# Patient Record
Sex: Female | Born: 1960 | Race: Black or African American | Hispanic: No | Smoking: Never smoker
Health system: Southern US, Community
[De-identification: ages and names within clinical notes are randomized; demographics above are authoritative.]

## PROBLEM LIST (undated history)

## (undated) DIAGNOSIS — M549 Dorsalgia, unspecified: Secondary | ICD-10-CM

## (undated) DIAGNOSIS — N189 Chronic kidney disease, unspecified: Secondary | ICD-10-CM

## (undated) DIAGNOSIS — E669 Obesity, unspecified: Secondary | ICD-10-CM

## (undated) DIAGNOSIS — K219 Gastro-esophageal reflux disease without esophagitis: Secondary | ICD-10-CM

## (undated) DIAGNOSIS — G4733 Obstructive sleep apnea (adult) (pediatric): Secondary | ICD-10-CM

## (undated) DIAGNOSIS — J45909 Unspecified asthma, uncomplicated: Secondary | ICD-10-CM

## (undated) DIAGNOSIS — T7840XA Allergy, unspecified, initial encounter: Secondary | ICD-10-CM

## (undated) DIAGNOSIS — G8929 Other chronic pain: Secondary | ICD-10-CM

## (undated) DIAGNOSIS — M1990 Unspecified osteoarthritis, unspecified site: Secondary | ICD-10-CM

## (undated) DIAGNOSIS — I10 Essential (primary) hypertension: Secondary | ICD-10-CM

## (undated) HISTORY — PX: ABDOMINAL HYSTERECTOMY: SHX81

## (undated) HISTORY — DX: Allergy, unspecified, initial encounter: T78.40XA

## (undated) HISTORY — DX: Unspecified osteoarthritis, unspecified site: M19.90

## (undated) HISTORY — PX: KNEE SURGERY: SHX244

## (undated) HISTORY — DX: Gastro-esophageal reflux disease without esophagitis: K21.9

## (undated) HISTORY — DX: Chronic kidney disease, unspecified: N18.9

---

## 2013-10-23 ENCOUNTER — Ambulatory Visit: Payer: Self-pay | Admitting: Internal Medicine

## 2013-11-06 ENCOUNTER — Ambulatory Visit: Payer: Self-pay | Admitting: Internal Medicine

## 2014-08-01 ENCOUNTER — Encounter (HOSPITAL_COMMUNITY): Payer: Self-pay | Admitting: Emergency Medicine

## 2014-08-01 ENCOUNTER — Emergency Department (HOSPITAL_COMMUNITY)
Admission: EM | Admit: 2014-08-01 | Discharge: 2014-08-01 | Disposition: A | Discharge status: Home / Self Care | Payer: BC Managed Care – PPO | Attending: Emergency Medicine | Admitting: Emergency Medicine

## 2014-08-01 DIAGNOSIS — S199XXA Unspecified injury of neck, initial encounter: Secondary | ICD-10-CM | POA: Diagnosis not present

## 2014-08-01 DIAGNOSIS — Z8709 Personal history of other diseases of the respiratory system: Secondary | ICD-10-CM | POA: Insufficient documentation

## 2014-08-01 DIAGNOSIS — M549 Dorsalgia, unspecified: Secondary | ICD-10-CM

## 2014-08-01 DIAGNOSIS — Y92410 Unspecified street and highway as the place of occurrence of the external cause: Secondary | ICD-10-CM | POA: Diagnosis not present

## 2014-08-01 DIAGNOSIS — Y9389 Activity, other specified: Secondary | ICD-10-CM | POA: Diagnosis not present

## 2014-08-01 DIAGNOSIS — E669 Obesity, unspecified: Secondary | ICD-10-CM | POA: Diagnosis not present

## 2014-08-01 DIAGNOSIS — G8929 Other chronic pain: Secondary | ICD-10-CM | POA: Diagnosis not present

## 2014-08-01 DIAGNOSIS — J45909 Unspecified asthma, uncomplicated: Secondary | ICD-10-CM | POA: Diagnosis not present

## 2014-08-01 DIAGNOSIS — I10 Essential (primary) hypertension: Secondary | ICD-10-CM | POA: Diagnosis not present

## 2014-08-01 DIAGNOSIS — S3992XA Unspecified injury of lower back, initial encounter: Secondary | ICD-10-CM | POA: Diagnosis not present

## 2014-08-01 HISTORY — DX: Obstructive sleep apnea (adult) (pediatric): G47.33

## 2014-08-01 HISTORY — DX: Unspecified asthma, uncomplicated: J45.909

## 2014-08-01 HISTORY — DX: Essential (primary) hypertension: I10

## 2014-08-01 HISTORY — DX: Other chronic pain: G89.29

## 2014-08-01 HISTORY — DX: Dorsalgia, unspecified: M54.9

## 2014-08-01 HISTORY — DX: Obesity, unspecified: E66.9

## 2014-08-01 MED ORDER — IBUPROFEN 600 MG PO TABS: 600.0000 mg | ORAL_TABLET | Freq: Three times a day (TID) | ORAL | Status: DC

## 2014-08-01 MED ORDER — METHOCARBAMOL 500 MG PO TABS: 500.0000 mg | ORAL_TABLET | Freq: Two times a day (BID) | ORAL | Status: DC
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
## 2014-08-01 NOTE — Discharge Instructions (Signed)

## 2014-08-02 NOTE — ED Provider Notes (Signed)
Medical screening examination/treatment/procedure(s) were performed by non-physician practitioner and as supervising physician I was immediately available for consultation/collaboration.   EKG Interpretation None        Elwin MochaBlair Ellana Kawa, MD 08/02/14 [phone]

## 2014-08-04 ENCOUNTER — Encounter: Payer: Self-pay | Admitting: Internal Medicine

## 2016-07-26 ENCOUNTER — Emergency Department
Admission: EM | Admit: 2016-07-26 | Discharge: 2016-07-26 | Disposition: A | Discharge status: Home / Self Care | Payer: BLUE CROSS/BLUE SHIELD | Attending: Emergency Medicine | Admitting: Emergency Medicine

## 2016-07-26 ENCOUNTER — Emergency Department: Payer: BLUE CROSS/BLUE SHIELD

## 2016-07-26 ENCOUNTER — Encounter: Payer: Self-pay | Admitting: Emergency Medicine

## 2016-07-26 DIAGNOSIS — Z79899 Other long term (current) drug therapy: Secondary | ICD-10-CM | POA: Insufficient documentation

## 2016-07-26 DIAGNOSIS — E876 Hypokalemia: Secondary | ICD-10-CM | POA: Diagnosis not present

## 2016-07-26 DIAGNOSIS — I10 Essential (primary) hypertension: Secondary | ICD-10-CM | POA: Diagnosis not present

## 2016-07-26 DIAGNOSIS — J45909 Unspecified asthma, uncomplicated: Secondary | ICD-10-CM | POA: Diagnosis not present

## 2016-07-26 DIAGNOSIS — R002 Palpitations: Secondary | ICD-10-CM

## 2016-07-26 DIAGNOSIS — Z7982 Long term (current) use of aspirin: Secondary | ICD-10-CM | POA: Diagnosis not present

## 2016-07-26 DIAGNOSIS — R42 Dizziness and giddiness: Secondary | ICD-10-CM | POA: Diagnosis present

## 2016-07-26 LAB — CBC
HCT: 42.6 % (ref 35.0–47.0)
Hemoglobin: 14.4 g/dL (ref 12.0–16.0)
MCH: 26.6 pg (ref 26.0–34.0)
MCHC: 33.8 g/dL (ref 32.0–36.0)
MCV: 78.9 fL — ABNORMAL LOW (ref 80.0–100.0)
PLATELETS: 223 10*3/uL (ref 150–440)
RBC: 5.4 MIL/uL — ABNORMAL HIGH (ref 3.80–5.20)
RDW: 14.4 % (ref 11.5–14.5)
WBC: 5.5 10*3/uL (ref 3.6–11.0)

## 2016-07-26 LAB — URINALYSIS COMPLETE WITH MICROSCOPIC (ARMC ONLY)
BILIRUBIN URINE: NEGATIVE
Bacteria, UA: NONE SEEN
GLUCOSE, UA: NEGATIVE mg/dL
Hgb urine dipstick: NEGATIVE
KETONES UR: NEGATIVE mg/dL
LEUKOCYTES UA: NEGATIVE
NITRITE: NEGATIVE
PH: 8 (ref 5.0–8.0)
Protein, ur: NEGATIVE mg/dL
SPECIFIC GRAVITY, URINE: 1.011 (ref 1.005–1.030)

## 2016-07-26 LAB — BASIC METABOLIC PANEL
Anion gap: 8 (ref 5–15)
BUN: 15 mg/dL (ref 6–20)
CO2: 30 mmol/L (ref 22–32)
CREATININE: 0.73 mg/dL (ref 0.44–1.00)
Calcium: 9.5 mg/dL (ref 8.9–10.3)
Chloride: 102 mmol/L (ref 101–111)
GFR calc Af Amer: >60 mL/min (ref >60)
Glucose, Bld: 106 mg/dL — ABNORMAL HIGH (ref 65–99)
POTASSIUM: 3.3 mmol/L — AB (ref 3.5–5.1)
SODIUM: 140 mmol/L (ref 135–145)

## 2016-07-26 LAB — TROPONIN I: Troponin I: <0.03 ng/mL (ref <0.03)

## 2016-07-26 MED ORDER — POTASSIUM CHLORIDE CRYS ER 20 MEQ PO TBCR
20.0000 meq | EXTENDED_RELEASE_TABLET | Freq: Once | ORAL | Status: AC
  Administered 2016-07-26: 20 meq via ORAL
  Filled 2016-07-26: qty 1
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
## 2016-07-26 NOTE — ED Provider Notes (Signed)
[facility] Emergency Department Provider Note   ____________________________________________   First MD Initiated Contact with Patient 07/26/16 [phone]     (approximate)  I have reviewed the triage vital signs and the nursing notes.   HISTORY  Chief Complaint Dizziness   HPI Kamree Anglada is a 55 y.o. female with a history of hypertension as well as palpitations who is presenting to the emergency department with palpitations. She says that about 7:30 this morning she began to feel a "fluttering" in her chest. She says that she has had palpitations in the past, about 15 years ago. She said that during this episode she also felt lightheaded. She denies losing consciousness. Denies any chest pain. Denies any excessive caffeine intake. Denies any anxiety or stress. Denies any drug use. She said that the episode resolved spontaneously and that she now feels back to her baseline.   Past Medical History:  Diagnosis Date  . Asthma   . Chronic back pain   . Hypertension   . Obesity   . OSA (obstructive sleep apnea)     There are no active problems to display for this patient.   Past Surgical History:  Procedure Laterality Date  . ABDOMINAL HYSTERECTOMY    . KNEE SURGERY      Prior to Admission medications   Medication Sig Start Date End Date Taking? Authorizing Provider  amLODipine (NORVASC) 10 MG tablet Take 10 mg by mouth daily.   Yes Historical Provider, MD  aspirin 81 MG chewable tablet Chew by mouth daily.   Yes Historical Provider, MD  Calcium Carb-Cholecalciferol (CALCIUM-VITAMIN D) 600-400 MG-UNIT TABS Take 1 tablet by mouth 2 (two) times daily.   Yes Historical Provider, MD  cetirizine (ZYRTEC) 10 MG tablet Take 10 mg by mouth daily.   Yes Historical Provider, MD  Cholecalciferol 1000 units tablet Take 1,000 Units by mouth daily.   Yes Historical Provider, MD  cyanocobalamin 1000 MCG tablet Take 1,000 mcg by mouth daily.   Yes Historical  Provider, MD  fluticasone (FLONASE) 50 MCG/ACT nasal spray Place 2 sprays into both nostrils daily.   Yes Historical Provider, MD  hydrochlorothiazide (HYDRODIURIL) 25 MG tablet Take 25 mg by mouth daily.  07/14/14  Yes Historical Provider, MD  losartan (COZAAR) 100 MG tablet Take 100 mg by mouth daily.   Yes Historical Provider, MD  montelukast (SINGULAIR) 10 MG tablet Take 10 mg by mouth at bedtime.   Yes Historical Provider, MD  naproxen sodium (ANAPROX) 220 MG tablet Take 220 mg by mouth 2 (two) times daily with a meal.   Yes Historical Provider, MD  Omega-3 Fatty Acids (FISH OIL) 1000 MG CAPS Take 1 capsule by mouth 2 (two) times daily.   Yes Historical Provider, MD  PROVENTIL HFA 108 (90 BASE) MCG/ACT inhaler Inhale 1 puff into the lungs every 6 (six) hours as needed for wheezing or shortness of breath.  06/11/14  Yes Historical Provider, MD  SYMBICORT 160-4.5 MCG/ACT inhaler Inhale 2 puffs into the lungs daily.  07/14/14  Yes Historical Provider, MD  vitamin E 400 UNIT capsule Take 400 Units by mouth daily.   Yes Historical Provider, MD    Allergies Aspartame and phenylalanine; Lisinopril; and Sulfa antibiotics  History reviewed. No pertinent family history.  Social History Social History  Substance Use Topics  . Smoking status: Never Smoker  . Smokeless tobacco: Never Used  . Alcohol use No    Review of Systems Constitutional: No fever/chills Eyes: No visual changes. ENT:  No sore throat. Cardiovascular: Denies chest pain. Respiratory: Denies shortness of breath. Gastrointestinal: No abdominal pain.  No nausea, no vomiting.  No diarrhea.  No constipation. Genitourinary: Negative for dysuria. Musculoskeletal: Negative for back pain. Skin: Negative for rash. Neurological: Negative for headaches, focal weakness or numbness.  10-point ROS otherwise negative.  ____________________________________________   PHYSICAL EXAM:  VITAL SIGNS: ED Triage Vitals [07/26/16 0909]  Enc  Vitals Group     BP (!) 152/86     Pulse Rate 70     Resp 18     Temp 98 F (36.7 C)     Temp Source Oral     SpO2 97 %     Weight 257 lb (116.6 kg)     Height 5\' 4"  (1.626 m)     Head Circumference      Peak Flow      Pain Score 0     Pain Loc      Pain Edu?      Excl. in GC?     Constitutional: Alert and oriented. Well appearing and in no acute distress. Eyes: Conjunctivae are normal. PERRL. EOMI. Head: Atraumatic. Nose: No congestion/rhinnorhea. Mouth/Throat: Mucous membranes are moist.   Neck: No stridor.   Cardiovascular: Normal rate, regular rhythm. Grossly normal heart sounds.  Good peripheral circulation. Respiratory: Normal respiratory effort.  No retractions. Lungs CTAB. Gastrointestinal: Soft and nontender. No distention. No CVA tenderness. Musculoskeletal: No lower extremity tenderness nor edema.  No joint effusions. Neurologic:  Normal speech and language. No gross focal neurologic deficits are appreciated. No gait instability. Skin:  Skin is warm, dry and intact. No rash noted. Psychiatric: Mood and affect are normal. Speech and behavior are normal.  ____________________________________________   LABS (all labs ordered are listed, but only abnormal results are displayed)  Labs Reviewed  BASIC METABOLIC PANEL - Abnormal; Notable for the following:       Result Value   Potassium 3.3 (*)    Glucose, Bld 106 (*)    All other components within normal limits  CBC - Abnormal; Notable for the following:    RBC 5.40 (*)    MCV 78.9 (*)    All other components within normal limits  URINALYSIS COMPLETEWITH MICROSCOPIC ([facility] ONLY) - Abnormal; Notable for the following:    Color, Urine YELLOW (*)    APPearance HAZY (*)    Squamous Epithelial / LPF 0-5 (*)    All other components within normal limits  TROPONIN I  CBG MONITORING, ED   ____________________________________________  EKG  ED ECG REPORT I, Arelia Longest, the attending physician, personally  viewed and interpreted this ECG.   Date: 07/26/2016  EKG Time: 0916   Rate: 69  Rhythm: normal sinus rhythm  Axis: normal  Intervals:none  ST&T Change: No ST segment elevation or depression. No abnormal T-wave inversion.  ____________________________________________  RADIOLOGY    DG Chest 2 View (Final result)  Result time 07/26/16 11:01:43  Final result by Oley Balm, MD (07/26/16 11:01:43)           Narrative:   CLINICAL DATA: Pt to ed with c/o "heart fluttering" And weakness and dizziness. Pt states it started this morning. Pt states she has no dizziness or SOB.  EXAM: CHEST - 2 VIEW  COMPARISON: none  FINDINGS: Lungs are clear.  Heart size and mediastinal contours are within normal limits.  No effusion. No pneumothorax.  Visualized bones unremarkable.  IMPRESSION: No acute cardiopulmonary disease.   Electronically Signed By:  Corlis Leak Hassell M.D. On: 07/26/2016 11:01          ____________________________________________   PROCEDURES  Procedure(s) performed:   Procedures  Critical Care performed:   ____________________________________________   INITIAL IMPRESSION / ASSESSMENT AND PLAN / ED COURSE  Pertinent labs & imaging results that were available during my care of the patient were reviewed by me and considered in my medical decision making (see chart for details).  ----------------------------------------- 1:11 PM on 07/26/2016 -----------------------------------------  Patient resting of the lid this time. Very reassuring workup and no further episodes of palpitations or arrhythmia throughout her stay here in the emergency department. She says that she is seen [facility] and has a follow-up appointment with her primary care doctor this Friday. We discussed referral through her primary care doctor to cardiology and she is understanding of this plan and willing to comply. Mild hypokalemia with repletion here in the  emergency department.   Clinical Course     ____________________________________________   FINAL CLINICAL IMPRESSION(S) / ED DIAGNOSES  Palpitations. Hypokalemia.    NEW MEDICATIONS STARTED DURING THIS VISIT:  New Prescriptions   No medications on file     Note:  This document was prepared using Dragon voice recognition software and may include unintentional dictation errors.    Myrna Blazeravid Matthew Soul Hackman, MD 07/26/16 [phone]

## 2016-07-26 NOTE — ED Notes (Signed)
Pt returned from radiology. Pt in NAD.

## 2017-07-11 ENCOUNTER — Ambulatory Visit (INDEPENDENT_AMBULATORY_CARE_PROVIDER_SITE_OTHER): Payer: BLUE CROSS/BLUE SHIELD | Admitting: Physician Assistant

## 2017-07-12 ENCOUNTER — Ambulatory Visit (INDEPENDENT_AMBULATORY_CARE_PROVIDER_SITE_OTHER): Payer: BLUE CROSS/BLUE SHIELD | Admitting: Physician Assistant

## 2021-02-16 ENCOUNTER — Other Ambulatory Visit: Payer: Self-pay

## 2021-02-16 DIAGNOSIS — I872 Venous insufficiency (chronic) (peripheral): Secondary | ICD-10-CM

## 2021-03-16 ENCOUNTER — Encounter: Payer: Self-pay | Admitting: Vascular Surgery

## 2021-03-16 ENCOUNTER — Encounter: Payer: Self-pay | Admitting: Physician Assistant

## 2021-03-16 ENCOUNTER — Other Ambulatory Visit: Payer: Self-pay

## 2021-03-16 ENCOUNTER — Ambulatory Visit (INDEPENDENT_AMBULATORY_CARE_PROVIDER_SITE_OTHER): Payer: No Typology Code available for payment source | Admitting: Physician Assistant

## 2021-03-16 ENCOUNTER — Ambulatory Visit (HOSPITAL_COMMUNITY)
Admission: RE | Admit: 2021-03-16 | Discharge: 2021-03-16 | Disposition: A | Discharge status: Home / Self Care | Payer: No Typology Code available for payment source | Source: Ambulatory Visit | Attending: Vascular Surgery | Admitting: Vascular Surgery

## 2021-03-16 VITALS — BP 119/73 | HR 73 | Temp 98.3°F | Resp 20 | Ht 64.0 in | Wt 245.3 lb

## 2021-03-16 DIAGNOSIS — M7989 Other specified soft tissue disorders: Secondary | ICD-10-CM | POA: Diagnosis not present

## 2021-03-16 DIAGNOSIS — I872 Venous insufficiency (chronic) (peripheral): Secondary | ICD-10-CM | POA: Insufficient documentation

## 2021-03-16 NOTE — Progress Notes (Signed)
Requested by:  [place] [id number] [address] [place],  [place] [address]  Reason for consultation: venous insufficiency of BLE   History of Present Illness   Joy Chang is a 60 y.o. (1960/11/04) female who presents for evaluation of bilateral lower extremity swelling. Initially she would have more edema in her right leg but It is the same in both legs now.  She says she has had swelling for over 20 years but more recently it has increased. She does elevate in a recliner with some improvement of swelling and also swelling is better upon first waking in the morning but then as the day goes on it increases. She cannot tolerate elevating for very long because she gets severe cramping and tingling in her legs, which is then painful. She wears knee high compression stockings from the [place]. She does have some improvement with them. She says she has a "light" and also "moderate" compression pair and she cannot tolerate the moderate for very long because they make her legs hurt. She explains that she does a lot of sitting during the day at work. Her ambulation is limited because of bilateral knee pain. She uses a cane to walk and wears bilateral knee braces.  She has no history of DVT. No family history of DVT or venous disease.   She has had some issues with hypertension and renal insufficiency in the past. She explains that now that her blood pressure is better managed that at her last follow up with her Nephrologist in February that everything was normal. She has no cardiac issues that she knows of  Venous symptoms include: swelling/ edema Onset/duration: > 20 years Occupation: Respiratory Therapist Aggravating factors: sitting, standing Alleviating factors: elevation, comrpression Compression:  Knee high Helps:  yes Pain medications: none Previous vein procedures: none History of DVT:  none  Past Medical History:  Diagnosis Date  . Asthma   . Chronic back pain   . Hypertension   .  Obesity   . OSA (obstructive sleep apnea)     Past Surgical History:  Procedure Laterality Date  . ABDOMINAL HYSTERECTOMY    . KNEE SURGERY      Social History   Socioeconomic History  . Marital status: Legally Separated    Spouse name: Not on file  . Number of children: 3  . Years of education: Not on file  . Highest education level: Not on file  Occupational History  . Not on file  Tobacco Use  . Smoking status: Never Smoker  . Smokeless tobacco: Never Used  Substance and Sexual Activity  . Alcohol use: No  . Drug use: No  . Sexual activity: Not on file  Other Topics Concern  . Not on file  Social History Narrative  . Not on file   Social Determinants of Health   Financial Resource Strain: Not on file  Food Insecurity: Not on file  Transportation Needs: Not on file  Physical Activity: Not on file  Stress: Not on file  Social Connections: Not on file  Intimate Partner Violence: Not on file   History reviewed. No pertinent family history.  Current Outpatient Medications  Medication Sig Dispense Refill  . amLODipine (NORVASC) 10 MG tablet Take 10 mg by mouth daily.    Marland Kitchen aspirin 81 MG chewable tablet Chew by mouth daily.    . Calcium Carb-Cholecalciferol (CALCIUM-VITAMIN D) 600-400 MG-UNIT TABS Take 1 tablet by mouth 2 (two) times daily.    . Cholecalciferol 1000  units tablet Take 1,000 Units by mouth daily.    . diclofenac Sodium (VOLTAREN) 1 % GEL Apply topically.    . ferrous sulfate 325 (65 FE) MG tablet Take by mouth.    . fluticasone (FLONASE) 50 MCG/ACT nasal spray Place 2 sprays into both nostrils daily.    . fluticasone-salmeterol (ADVAIR) 250-50 MCG/ACT AEPB Inhale into the lungs.    . hydrochlorothiazide (HYDRODIURIL) 25 MG tablet Take 25 mg by mouth daily.     . potassium chloride SA (KLOR-CON) 20 MEQ tablet Take by mouth.    . predniSONE (DELTASONE) 10 MG tablet 3 pills daily for 3 days then 2 pills daily for 3 days then 1 pill daily for 3 days    .  spironolactone (ALDACTONE) 25 MG tablet Take 1 tablet by mouth 2 (two) times daily.    . cetirizine (ZYRTEC) 10 MG tablet Take 10 mg by mouth daily. (Patient not taking: Reported on 03/16/2021)    . cyanocobalamin 1000 MCG tablet Take 1,000 mcg by mouth daily. (Patient not taking: Reported on 03/16/2021)    . fexofenadine (ALLEGRA) 60 MG tablet Take by mouth.    . montelukast (SINGULAIR) 10 MG tablet Take 10 mg by mouth at bedtime.    . naproxen sodium (ANAPROX) 220 MG tablet Take 220 mg by mouth 2 (two) times daily with a meal.    . Omega-3 Fatty Acids (FISH OIL) 1000 MG CAPS Take 1 capsule by mouth 2 (two) times daily.    Marland Kitchen PROVENTIL HFA 108 (90 BASE) MCG/ACT inhaler Inhale 1 puff into the lungs every 6 (six) hours as needed for wheezing or shortness of breath.     . vitamin E 400 UNIT capsule Take 400 Units by mouth daily.     No current facility-administered medications for this visit.    Allergies  Allergen Reactions  . Aspartame And Phenylalanine   . Lisinopril Cough  . Sulfa Antibiotics Hives    REVIEW OF SYSTEMS (negative unless checked):   Cardiac:  []  Chest pain or chest pressure? []  Shortness of breath upon activity? []  Shortness of breath when lying flat? []  Irregular heart rhythm?  Vascular:  []  Pain in calf, thigh, or hip brought on by walking? []  Pain in feet at night that wakes you up from your sleep? []  Blood clot in your veins? [x]  Leg swelling?  Pulmonary:  []  Oxygen at home? []  Productive cough? []  Wheezing?  Neurologic:  []  Sudden weakness in arms or legs? []  Sudden numbness in arms or legs? []  Sudden onset of difficult speaking or slurred speech? []  Temporary loss of vision in one eye? []  Problems with dizziness?  Gastrointestinal:  []  Blood in stool? []  Vomited blood?  Genitourinary:  []  Burning when urinating? []  Blood in urine?  Psychiatric:  []  Major depression  Hematologic:  []  Bleeding problems? []  Problems with blood  clotting?  Dermatologic:  []  Rashes or ulcers?  Constitutional:  []  Fever or chills?  Ear/Nose/Throat:  []  Change in hearing? []  Nose bleeds? []  Sore throat?  Musculoskeletal:  []  Back pain? []  Joint pain? []  Muscle pain?   Physical Examination     Vitals:   03/16/21 1059  BP: 119/73  Pulse: 73  Resp: 20  Temp: 98.3 F (36.8 C)  TempSrc: Temporal  SpO2: 98%  Weight: 245 lb 4.8 oz (111.3 kg)  Height: 5\' 4"  (1.626 m)   Body mass index is 42.11 kg/m.  General:  WDWN in NAD; vital signs documented above Gait: Not  observed HENT: WNL, normocephalic Pulmonary: normal non-labored breathing  Cardiac: regular HR Abdomen: soft, NT, no masses Vascular Exam/Pulses:2+ popliteal, Dp and Pt pulses bilaterally. Feet warm and well perfused Extremities: without varicose veins, without reticular veins, with 2+ pitting edema right leg and ankle, 1+ pitting edema of Left lower leg and ankle, without stasis pigmentation, without lipodermatosclerosis, without ulcers Musculoskeletal: no muscle wasting or atrophy  Neurologic: A&O X 3;  No focal weakness or paresthesias are detected Psychiatric:  The pt has Normal affect.  Non-invasive Vascular Imaging   BLE Venous Insufficiency Duplex (6/722):   RLE:   No DVT and SVT  GSV reflux prox calf and mid calf  GSV diameter 0.42-0.616  SSV reflux in mid calf  No deep venous reflux  AAGSV no reflux   LLE:  No DVT and SVT  No GSV reflux   GSV diameter 0.365- 0.554  No SSV reflux   No deep venous reflux   Medical Decision Making   Deann Pittman is a 60 y.o. female who presents with bilateral lower extremity edema and swelling. Today's venous duplex shows no deep reflux bilaterally, no DVT or SVT. She does have superficial reflux in the right GSV and SSV but not throughout. No superficial reflux in the left lower extremity identified at all. Based on these findings she is not candidate for further venous intervention. She  does not have the typical lymphedema swelling on clinical examination. I suspect her edema is multifactorial and may be somewhat side effect of some of her medications. I have recommended she follow up with her PCP for further evaluation and I recommend conservative therapy as described below   Based on the patient's history and examination, I recommend refraining from prolonged sitting or standing, daily elevation and compression stockings. I would really only recommend the 15-20 mmHg knee high compression for her. She requested obtaining a pair of the 20-30 mmHg knee high at today's visit.  She will follow up as needed if new or worsening symptoms  Thank you for allowing [place] to participate in this patient's care.   Graceann Congress, PA-C [facility] Office: [phone]  03/16/2021, 11:15 AM  Clinic MD: Chestine Spore / Lenell Antu

## 2021-05-13 ENCOUNTER — Other Ambulatory Visit: Payer: Self-pay

## 2021-05-13 ENCOUNTER — Emergency Department: Payer: No Typology Code available for payment source

## 2021-05-13 DIAGNOSIS — R0789 Other chest pain: Secondary | ICD-10-CM | POA: Diagnosis not present

## 2021-05-13 DIAGNOSIS — R079 Chest pain, unspecified: Secondary | ICD-10-CM | POA: Diagnosis present

## 2021-05-13 DIAGNOSIS — M5412 Radiculopathy, cervical region: Secondary | ICD-10-CM | POA: Diagnosis not present

## 2021-05-13 DIAGNOSIS — Z7951 Long term (current) use of inhaled steroids: Secondary | ICD-10-CM | POA: Insufficient documentation

## 2021-05-13 DIAGNOSIS — Z7982 Long term (current) use of aspirin: Secondary | ICD-10-CM | POA: Insufficient documentation

## 2021-05-13 DIAGNOSIS — J45909 Unspecified asthma, uncomplicated: Secondary | ICD-10-CM | POA: Insufficient documentation

## 2021-05-13 DIAGNOSIS — I10 Essential (primary) hypertension: Secondary | ICD-10-CM | POA: Diagnosis not present

## 2021-05-13 DIAGNOSIS — Z79899 Other long term (current) drug therapy: Secondary | ICD-10-CM | POA: Diagnosis not present

## 2021-05-13 LAB — BASIC METABOLIC PANEL
Anion gap: 7 (ref 5–15)
BUN: 22 mg/dL (ref 8–23)
CO2: 24 mmol/L (ref 22–32)
Calcium: 9.2 mg/dL (ref 8.9–10.3)
Chloride: 106 mmol/L (ref 98–111)
Creatinine, Ser: 0.65 mg/dL (ref 0.44–1.00)
GFR, Estimated: >60 mL/min (ref >60)
Glucose, Bld: 110 mg/dL — ABNORMAL HIGH (ref 70–99)
Potassium: 3.8 mmol/L (ref 3.5–5.1)
Sodium: 137 mmol/L (ref 135–145)

## 2021-05-13 LAB — CBC
HCT: 40.1 % (ref 36.0–46.0)
Hemoglobin: 13.4 g/dL (ref 12.0–15.0)
MCH: 27.4 pg (ref 26.0–34.0)
MCHC: 33.4 g/dL (ref 30.0–36.0)
MCV: 82 fL (ref 80.0–100.0)
Platelets: 236 10*3/uL (ref 150–400)
RBC: 4.89 MIL/uL (ref 3.87–5.11)
RDW: 13.9 % (ref 11.5–15.5)
WBC: 5.6 10*3/uL (ref 4.0–10.5)
nRBC: 0 % (ref 0.0–0.2)

## 2021-05-13 LAB — TROPONIN I (HIGH SENSITIVITY)
Troponin I (High Sensitivity): 9 ng/L (ref <18)
Troponin I (High Sensitivity): 8 ng/L (ref <18)

## 2021-05-13 NOTE — ED Triage Notes (Signed)
Pt to ED for upper left back pain, left arm pain and shob that has worsened the past few days Pt in NAD

## 2021-05-14 ENCOUNTER — Emergency Department
Admission: EM | Admit: 2021-05-14 | Discharge: 2021-05-14 | Disposition: A | Discharge status: Home / Self Care | Payer: No Typology Code available for payment source | Attending: Emergency Medicine | Admitting: Emergency Medicine

## 2021-05-14 DIAGNOSIS — M5412 Radiculopathy, cervical region: Secondary | ICD-10-CM

## 2021-05-14 DIAGNOSIS — R0789 Other chest pain: Secondary | ICD-10-CM

## 2021-05-14 MED ORDER — NAPROXEN 375 MG PO TABS: 375.0000 mg | ORAL_TABLET | Freq: Two times a day (BID) | ORAL | 0 refills | Status: AC

## 2021-05-14 MED ORDER — PREDNISONE 20 MG PO TABS: 40.0000 mg | ORAL_TABLET | Freq: Every day | ORAL | 0 refills | Status: AC

## 2021-05-14 NOTE — ED Provider Notes (Signed)
[facility] Emergency Department Provider Note  ____________________________________________   Event Date/Time   First MD Initiated Contact with Patient 05/14/21 [phone]     (approximate)  I have reviewed the triage vital signs and the nursing notes.   HISTORY  Chief Complaint Back Pain and Shortness of Breath    HPI Joy Chang is a 60 y.o. female   with past medical history of hypertension, OSA, here with left-sided arm and chest pain.  The patient states that for the last 2 weeks, she has had intermittent aching, throbbing pain in her left shoulder/upper chest area.  She has had pain radiates down the lateral aspect of her left arm down towards her fourth and fifth digits.  This is been fairly constant and intermittently worsening.  She has had some numbness and tingling and felt like her arm is asleep.  Pain is worse with certain movements.  No alleviating factors.  No shortness of breath.  No diaphoresis.  No other complaints.  No nausea or vomiting.       Past Medical History:  Diagnosis Date   Asthma    Chronic back pain    Hypertension    Obesity    OSA (obstructive sleep apnea)     There are no problems to display for this patient.   Past Surgical History:  Procedure Laterality Date   ABDOMINAL HYSTERECTOMY     KNEE SURGERY      Prior to Admission medications   Medication Sig Start Date End Date Taking? Authorizing Provider  naproxen (NAPROSYN) 375 MG tablet Take 1 tablet (375 mg total) by mouth 2 (two) times daily with a meal for 5 days. 05/14/21 05/19/21 Yes Shaune Pollack, MD  predniSONE (DELTASONE) 20 MG tablet Take 2 tablets (40 mg total) by mouth daily for 5 days. 05/14/21 05/19/21 Yes Shaune Pollack, MD  amLODipine (NORVASC) 10 MG tablet Take 10 mg by mouth daily.    [provider]  aspirin 81 MG chewable tablet Chew by mouth daily.    [provider]  Calcium Carb-Cholecalciferol (CALCIUM-VITAMIN D) 600-400  MG-UNIT TABS Take 1 tablet by mouth 2 (two) times daily.    [provider]  cetirizine (ZYRTEC) 10 MG tablet Take 10 mg by mouth daily. Patient not taking: Reported on 03/16/2021    [provider]  Cholecalciferol 1000 units tablet Take 1,000 Units by mouth daily.    [provider]  cyanocobalamin 1000 MCG tablet Take 1,000 mcg by mouth daily. Patient not taking: Reported on 03/16/2021    [provider]  diclofenac Sodium (VOLTAREN) 1 % GEL Apply topically. 10/01/20   [provider]  ferrous sulfate 325 (65 FE) MG tablet Take by mouth. 01/22/21   [provider]  fexofenadine (ALLEGRA) 60 MG tablet Take by mouth.    [provider]  fluticasone (FLONASE) 50 MCG/ACT nasal spray Place 2 sprays into both nostrils daily.    [provider]  fluticasone-salmeterol (ADVAIR) 250-50 MCG/ACT AEPB Inhale into the lungs. 07/31/20   [provider]  hydrochlorothiazide (HYDRODIURIL) 25 MG tablet Take 25 mg by mouth daily.  07/14/14   [provider]  montelukast (SINGULAIR) 10 MG tablet Take 10 mg by mouth at bedtime.    [provider]  Omega-3 Fatty Acids (FISH OIL) 1000 MG CAPS Take 1 capsule by mouth 2 (two) times daily.    [provider]  potassium chloride SA (KLOR-CON) 20 MEQ tablet Take by mouth. 01/27/21  [provider]  PROVENTIL HFA 108 (90 BASE) MCG/ACT inhaler Inhale 1 puff into the lungs every 6 (six) hours as needed for wheezing or shortness of breath.  06/11/14   [provider]  spironolactone (ALDACTONE) 25 MG tablet Take 1 tablet by mouth 2 (two) times daily. 02/21/20   [provider]  vitamin E 400 UNIT capsule Take 400 Units by mouth daily.    [provider]    Allergies Aspartame and phenylalanine, Lisinopril, and Sulfa antibiotics  No family history on file.  Social History Social History   Tobacco Use   Smoking status: Never    Smokeless tobacco: Never  Substance Use Topics   Alcohol use: No   Drug use: No    Review of Systems  Review of Systems  Constitutional:  Negative for chills and fever.  HENT:  Negative for sore throat.   Respiratory:  Negative for shortness of breath.   Cardiovascular:  Positive for chest pain.  Gastrointestinal:  Negative for abdominal pain.  Genitourinary:  Negative for flank pain.  Musculoskeletal:  Positive for neck pain.  Skin:  Negative for rash and wound.  Allergic/Immunologic: Negative for immunocompromised state.  Neurological:  Positive for numbness. Negative for weakness.  Hematological:  Does not bruise/bleed easily.  All other systems reviewed and are negative.   ____________________________________________  PHYSICAL EXAM:      VITAL SIGNS: ED Triage Vitals  Enc Vitals Group     BP 05/13/21 1818 (!) 147/82     Pulse Rate 05/13/21 1818 74     Resp 05/13/21 1818 16     Temp 05/13/21 1818 98.2 F (36.8 C)     Temp src --      SpO2 05/13/21 1818 97 %     Weight 05/13/21 1819 242 lb (109.8 kg)     Height 05/13/21 1819 5\' 4"  (1.626 m)     Head Circumference --      Peak Flow --      Pain Score 05/13/21 1819 7     Pain Loc --      Pain Edu? --      Excl. in GC? --      Physical Exam Vitals and nursing note reviewed.  Constitutional:      General: She is not in acute distress.    Appearance: She is well-developed.  HENT:     Head: Normocephalic and atraumatic.  Eyes:     Conjunctiva/sclera: Conjunctivae normal.  Neck:     Comments: Tenderness to palpation over the left upper lateral neck.  Positive Spurling's test with reproduction of her pain down the left arm. Cardiovascular:     Rate and Rhythm: Normal rate and regular rhythm.     Heart sounds: Normal heart sounds. No murmur heard.   No friction rub.  Pulmonary:     Effort: Pulmonary effort is normal. No respiratory distress.     Breath sounds: Normal breath sounds. No wheezing or rales.   Abdominal:     General: There is no distension.     Palpations: Abdomen is soft.     Tenderness: There is no abdominal tenderness.  Musculoskeletal:     Cervical back: Neck supple.  Skin:    General: Skin is warm.     Capillary Refill: Capillary refill takes less than 2 seconds.  Neurological:     Mental Status: She is alert and oriented to person, place, and time.     Motor: No abnormal muscle tone.  ____________________________________________   LABS (all labs ordered are listed, but only abnormal results are displayed)  Labs Reviewed  BASIC METABOLIC PANEL - Abnormal; Notable for the following components:      Result Value   Glucose, Bld 110 (*)    All other components within normal limits  CBC  TROPONIN I (HIGH SENSITIVITY)  TROPONIN I (HIGH SENSITIVITY)    ____________________________________________  EKG: Normal sinus rhythm with first-degree AV block.  Ventricular rate 78.  PR 214, QRS 94, QTc 417.  No acute ST elevations or depressions.  No EKG evidence of acute ischemia or infarct. ________________________________________  RADIOLOGY All imaging, including plain films, CT scans, and ultrasounds, independently reviewed by me, and interpretations confirmed via formal radiology reads.  ED MD interpretation:   Chest x-ray: Clear  Official radiology report(s): DG Chest 2 View  Result Date: 05/13/2021 CLINICAL DATA:  Arm pain EXAM: CHEST - 2 VIEW COMPARISON:  07/26/2016 FINDINGS: The heart size and mediastinal contours are within normal limits. Both lungs are clear. The visualized skeletal structures are unremarkable. IMPRESSION: No active cardiopulmonary disease. Electronically Signed   By: Jasmine Pang M.D.   On: 05/13/2021 18:55    ____________________________________________  PROCEDURES   Procedure(s) performed (including Critical Care):  Procedures  ____________________________________________  INITIAL IMPRESSION / MDM / ASSESSMENT AND PLAN / ED  COURSE  As part of my medical decision making, I reviewed the following data within the electronic MEDICAL RECORD NUMBER Nursing notes reviewed and incorporated, Old chart reviewed, Notes from prior ED visits, and [place] Controlled Substance Database       *Joy Chang was evaluated in Emergency Department on 05/14/2021 for the symptoms described in the history of present illness. She was evaluated in the context of the global COVID-19 pandemic, which necessitated consideration that the patient might be at risk for infection with the SARS-CoV-2 virus that causes COVID-19. Institutional protocols and algorithms that pertain to the evaluation of patients at risk for COVID-19 are in a state of rapid change based on information released by regulatory bodies including the CDC and federal and state organizations. These policies and algorithms were followed during the patient's care in the ED.  Some ED evaluations and interventions may be delayed as a result of limited staffing during the pandemic.*     Medical Decision Making: Well-appearing 60 year old female here with left upper chest/arm pain.  Pain is most consistent with likely cervical radiculopathy.  The pain is reproducible with Spurling's test on the left and shoots down a C7/C8 distribution.  Her EKG is nonischemic and troponins are negative x2.  Do not suspect ACS.  Chest x-ray reviewed and is clear.  No evidence of pneumonia or pneumothorax.  Pain is not concerning or consistent with dissection.  Will treat with course of steroids and outpatient follow-up.  Return precautions given.  No signs of cord compromise or muscle weakness.  ____________________________________________  FINAL CLINICAL IMPRESSION(S) / ED DIAGNOSES  Final diagnoses:  Cervical radiculopathy  Atypical chest pain     MEDICATIONS GIVEN DURING THIS VISIT:  Medications - No data to display   ED Discharge Orders          Ordered    predniSONE (DELTASONE) 20 MG tablet   Daily        05/14/21 0155    naproxen (NAPROSYN) 375 MG tablet  2 times daily with meals        05/14/21 0155             Note:  This document was prepared using Dragon voice recognition software and may include unintentional dictation errors.   Shaune PollackIsaacs, Latessa Tillis, MD 05/14/21 [phone]

## 2022-04-11 ENCOUNTER — Ambulatory Visit (LOCAL_COMMUNITY_HEALTH_CENTER): Payer: Self-pay

## 2022-04-11 DIAGNOSIS — Z111 Encounter for screening for respiratory tuberculosis: Secondary | ICD-10-CM

## 2022-04-14 ENCOUNTER — Ambulatory Visit (LOCAL_COMMUNITY_HEALTH_CENTER): Payer: Self-pay

## 2022-04-14 DIAGNOSIS — Z111 Encounter for screening for respiratory tuberculosis: Secondary | ICD-10-CM

## 2022-04-14 LAB — TB SKIN TEST
Induration: 0 mm
TB Skin Test: NEGATIVE

## 2022-04-18 ENCOUNTER — Ambulatory Visit (HOSPITAL_COMMUNITY)
Admission: EM | Admit: 2022-04-18 | Discharge: 2022-04-18 | Disposition: A | Discharge status: Home / Self Care | Payer: No Typology Code available for payment source | Attending: Family Medicine | Admitting: Family Medicine

## 2022-04-18 ENCOUNTER — Encounter (HOSPITAL_COMMUNITY): Payer: Self-pay

## 2022-04-18 DIAGNOSIS — J4541 Moderate persistent asthma with (acute) exacerbation: Secondary | ICD-10-CM

## 2022-04-18 DIAGNOSIS — J019 Acute sinusitis, unspecified: Secondary | ICD-10-CM

## 2022-04-18 MED ORDER — DOXYCYCLINE HYCLATE 100 MG PO CAPS: 100.0000 mg | ORAL_CAPSULE | Freq: Two times a day (BID) | ORAL | 0 refills | Status: AC

## 2022-04-18 MED ORDER — METHYLPREDNISOLONE ACETATE 80 MG/ML IJ SUSP
INTRAMUSCULAR | Status: AC
  Filled 2022-04-18: qty 1

## 2022-04-18 MED ORDER — METHYLPREDNISOLONE ACETATE 80 MG/ML IJ SUSP
80.0000 mg | Freq: Once | INTRAMUSCULAR | Status: AC
  Administered 2022-04-18: 80 mg via INTRAMUSCULAR

## 2022-04-18 NOTE — Discharge Instructions (Addendum)
You have been given a shot of depomedrol 80 mg today.  Take doxycycline 100 mg--1 capsule 2 times daily for 7 days

## 2022-04-18 NOTE — ED Provider Notes (Signed)
[facility]    CSN: [id number] Arrival date & time: 04/18/22  1122      History   Chief Complaint Chief Complaint  Patient presents with   Asthma   Cough    HPI Joy Chang is a 61 y.o. female.    Asthma  Cough  Here for cough, congestion, hoarseness, and now wheezing.  Since the spring she has had postnasal dc and rhinorrhea, with hoarseness and cough. No fever. She does take singulair and flonase already. Then about 3 weeks ago, she started having to use her inhaler more often, about 3 times daily.  She was seen at the [place] last week, and she is about to finish her 5 day course of prednisone. She actually feels like it helped some, though still coughing.   Past Medical History:  Diagnosis Date   Asthma    Chronic back pain    Hypertension    Obesity    OSA (obstructive sleep apnea)     There are no problems to display for this patient.   Past Surgical History:  Procedure Laterality Date   ABDOMINAL HYSTERECTOMY     KNEE SURGERY      OB History   No obstetric history on file.      Home Medications    Prior to Admission medications   Medication Sig Start Date End Date Taking? Authorizing Provider  doxycycline (VIBRAMYCIN) 100 MG capsule Take 1 capsule (100 mg total) by mouth 2 (two) times daily for 7 days. 04/18/22 04/25/22 Yes Harmoni Lucus, Janace Aris, MD  amLODipine (NORVASC) 10 MG tablet Take 10 mg by mouth daily.    [provider]  aspirin 81 MG chewable tablet Chew by mouth daily.    [provider]  Calcium Carb-Cholecalciferol (CALCIUM-VITAMIN D) 600-400 MG-UNIT TABS Take 1 tablet by mouth 2 (two) times daily.    [provider]  cetirizine (ZYRTEC) 10 MG tablet Take 10 mg by mouth daily. Patient not taking: Reported on 03/16/2021    [provider]  Cholecalciferol 1000 units tablet Take 1,000 Units by mouth daily.    [provider]  cyanocobalamin 1000 MCG tablet Take 1,000 mcg by mouth  daily. Patient not taking: Reported on 03/16/2021    [provider]  diclofenac Sodium (VOLTAREN) 1 % GEL Apply topically. 10/01/20   [provider]  ferrous sulfate 325 (65 FE) MG tablet Take by mouth. 01/22/21   [provider]  fexofenadine (ALLEGRA) 60 MG tablet Take by mouth.    [provider]  fluticasone (FLONASE) 50 MCG/ACT nasal spray Place 2 sprays into both nostrils daily.    [provider]  fluticasone-salmeterol (ADVAIR) 250-50 MCG/ACT AEPB Inhale into the lungs. 07/31/20   [provider]  hydrochlorothiazide (HYDRODIURIL) 25 MG tablet Take 25 mg by mouth daily.  07/14/14   [provider]  montelukast (SINGULAIR) 10 MG tablet Take 10 mg by mouth at bedtime.    [provider]  Omega-3 Fatty Acids (FISH OIL) 1000 MG CAPS Take 1 capsule by mouth 2 (two) times daily.    [provider]  potassium chloride SA (KLOR-CON) 20 MEQ tablet Take by mouth. 01/27/21   [provider]  PROVENTIL HFA 108 (90 BASE) MCG/ACT inhaler Inhale 1 puff into the lungs every 6 (six) hours as needed for wheezing or shortness of breath.  06/11/14   [provider]  spironolactone (ALDACTONE) 25 MG tablet Take 1 tablet by mouth 2 (two) times daily. 02/21/20  [provider]  vitamin E 400 UNIT capsule Take 400 Units by mouth daily.    [provider]    Family History History reviewed. No pertinent family history.  Social History Social History   Tobacco Use   Smoking status: Never   Smokeless tobacco: Never  Substance Use Topics   Alcohol use: No   Drug use: No     Allergies   Aspartame and phenylalanine, Lisinopril, and Sulfa antibiotics   Review of Systems Review of Systems  Respiratory:  Positive for cough.      Physical Exam Triage Vital Signs ED Triage Vitals [04/18/22 1318]  Enc Vitals Group     BP (!) 155/86     Pulse Rate 75     Resp 16     Temp 98 F (36.7 C)      Temp Source Oral     SpO2 96 %     Weight      Height      Head Circumference      Peak Flow      Pain Score      Pain Loc      Pain Edu?      Excl. in GC?    No data found.  Updated Vital Signs BP (!) 155/86 (BP Location: Left Arm)   Pulse 75   Temp 98 F (36.7 C) (Oral)   Resp 16   SpO2 96%   Visual Acuity Right Eye Distance:   Left Eye Distance:   Bilateral Distance:    Right Eye Near:   Left Eye Near:    Bilateral Near:     Physical Exam Vitals reviewed.  Constitutional:      General: She is not in acute distress.    Appearance: She is not ill-appearing, toxic-appearing or diaphoretic.  HENT:     Right Ear: Tympanic membrane and ear canal normal.     Left Ear: Tympanic membrane and ear canal normal.     Nose: Congestion present.     Mouth/Throat:     Mouth: Mucous membranes are moist.     Pharynx: No oropharyngeal exudate or posterior oropharyngeal erythema.  Eyes:     Extraocular Movements: Extraocular movements intact.     Conjunctiva/sclera: Conjunctivae normal.     Pupils: Pupils are equal, round, and reactive to light.  Cardiovascular:     Rate and Rhythm: Normal rate and regular rhythm.     Heart sounds: No murmur heard. Pulmonary:     Effort: Pulmonary effort is normal.     Breath sounds: No stridor. No wheezing, rhonchi or rales.  Musculoskeletal:     Cervical back: Neck supple.  Lymphadenopathy:     Cervical: No cervical adenopathy.  Skin:    Capillary Refill: Capillary refill takes less than 2 seconds.     Coloration: Skin is not jaundiced or pale.  Neurological:     Mental Status: She is alert and oriented to person, place, and time.  Psychiatric:        Behavior: Behavior normal.      UC Treatments / Results  Labs (all labs ordered are listed, but only abnormal results are displayed) Labs Reviewed - No data to display  EKG   Radiology No results found.  Procedures Procedures (including critical care  time)  Medications Ordered in UC Medications  methylPREDNISolone acetate (DEPO-MEDROL) injection 80 mg (has no administration in time range)    Initial Impression / Assessment and Plan / UC Course  I have reviewed the triage vital signs and the nursing notes.  Pertinent labs & imaging results that were available during my care of the patient were reviewed by me and considered in my medical decision making (see chart for details).     Lung exam is clear today. We will do an injection of steroids, and I am going to tx with doxy for poss sinusitis Final Clinical Impressions(s) / UC Diagnoses   Final diagnoses:  Moderate persistent asthma with exacerbation  Acute sinusitis, recurrence not specified, unspecified location     Discharge Instructions      You have been given a shot of depomedrol 80 mg today.  Take doxycycline 100 mg--1 capsule 2 times daily for 7 days     ED Prescriptions     Medication Sig Dispense Auth. Provider   doxycycline (VIBRAMYCIN) 100 MG capsule Take 1 capsule (100 mg total) by mouth 2 (two) times daily for 7 days. 14 capsule Marlinda Mike, Janace Aris, MD      PDMP not reviewed this encounter.   Zenia Resides, MD 04/18/22 1352

## 2022-04-18 NOTE — ED Triage Notes (Signed)
Onset 1 week pt reports cough and wheezing. She was given a 5-day course of prednisone, but reports no relief.

## 2022-04-25 ENCOUNTER — Other Ambulatory Visit: Payer: Self-pay | Admitting: Internal Medicine

## 2022-05-10 IMAGING — CR DG CHEST 2V
2 series · 2 of 2 positions shown · non-contrast
Comparison: 07/26/2016

CLINICAL DATA: Arm pain

EXAM:
CHEST - 2 VIEW

[chest pa]
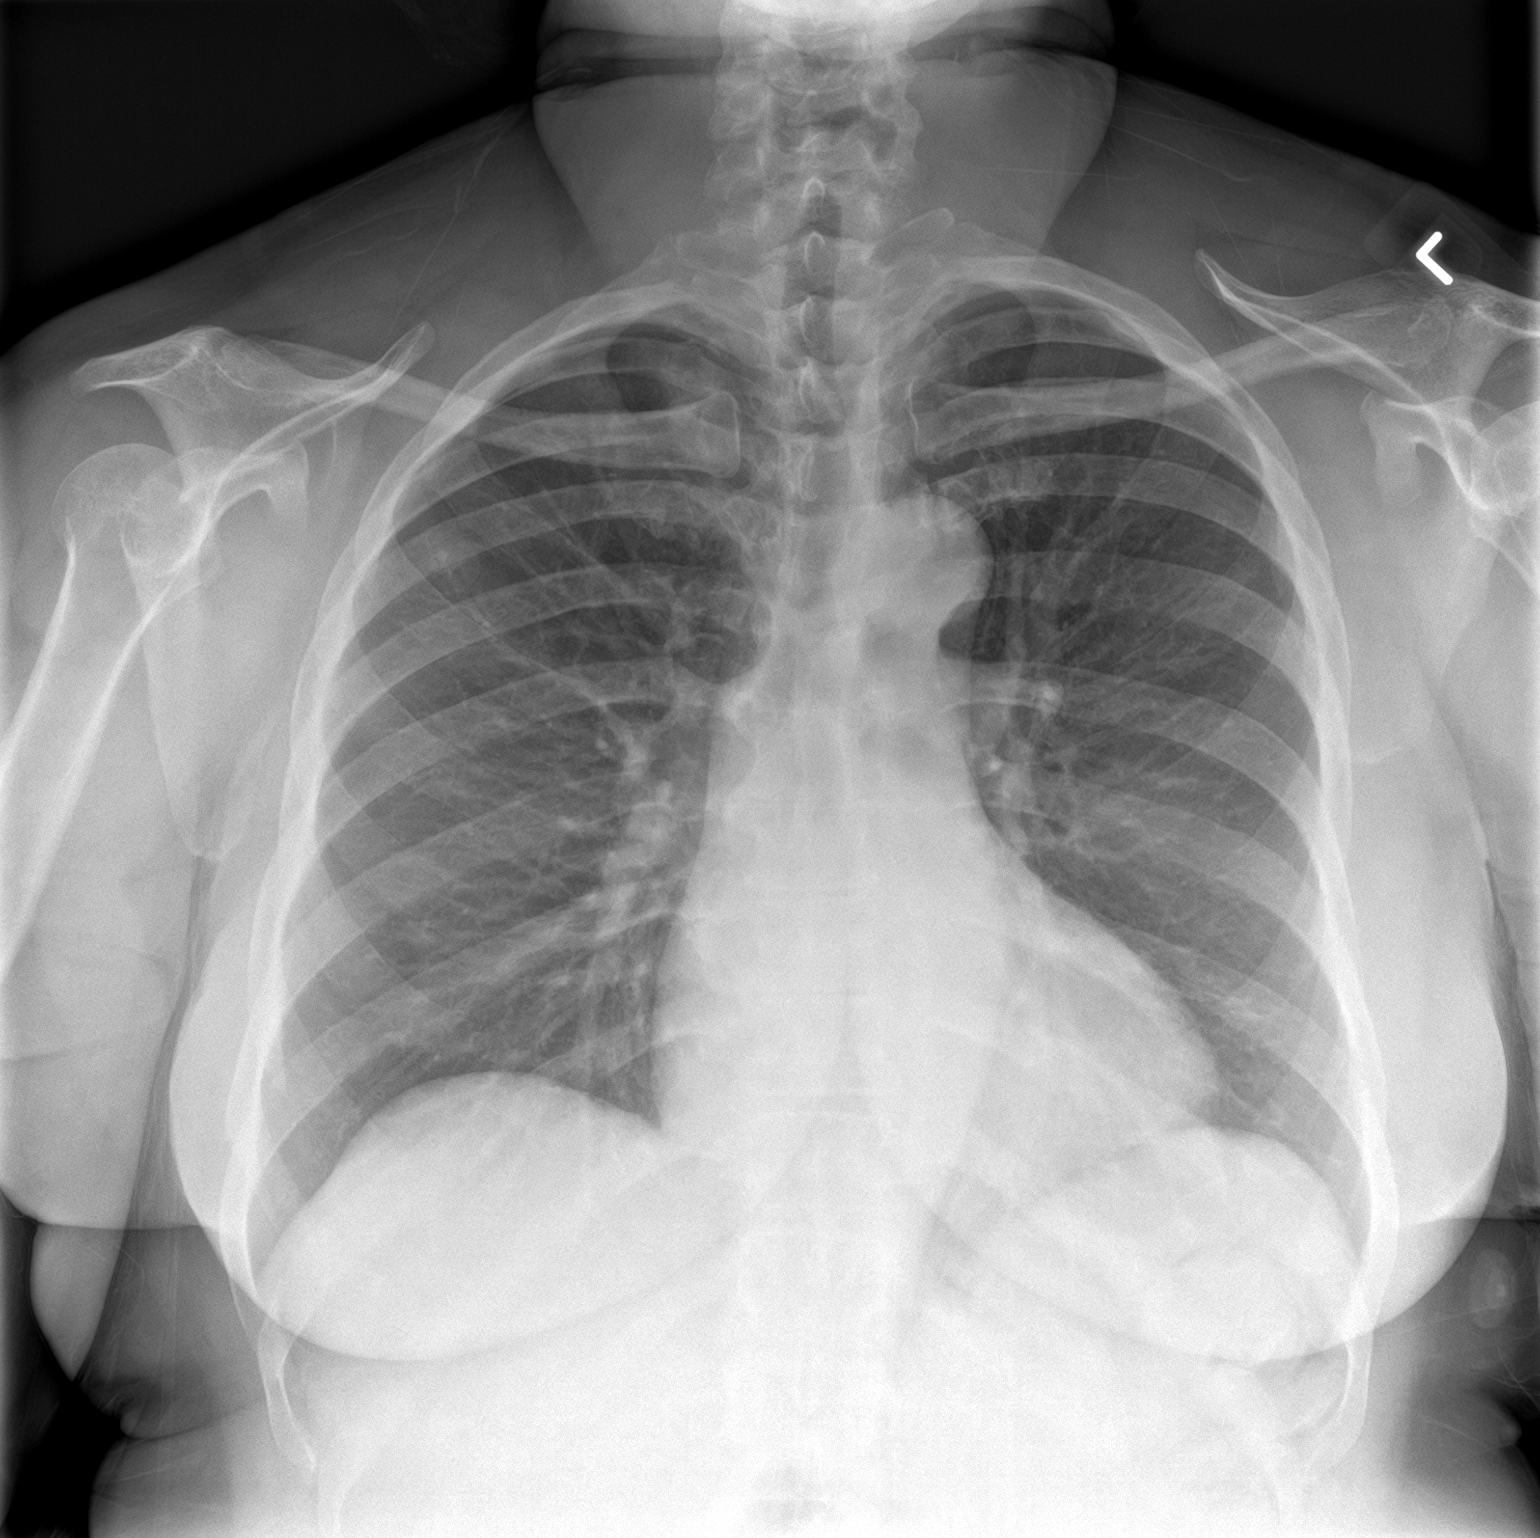

[chest lat]
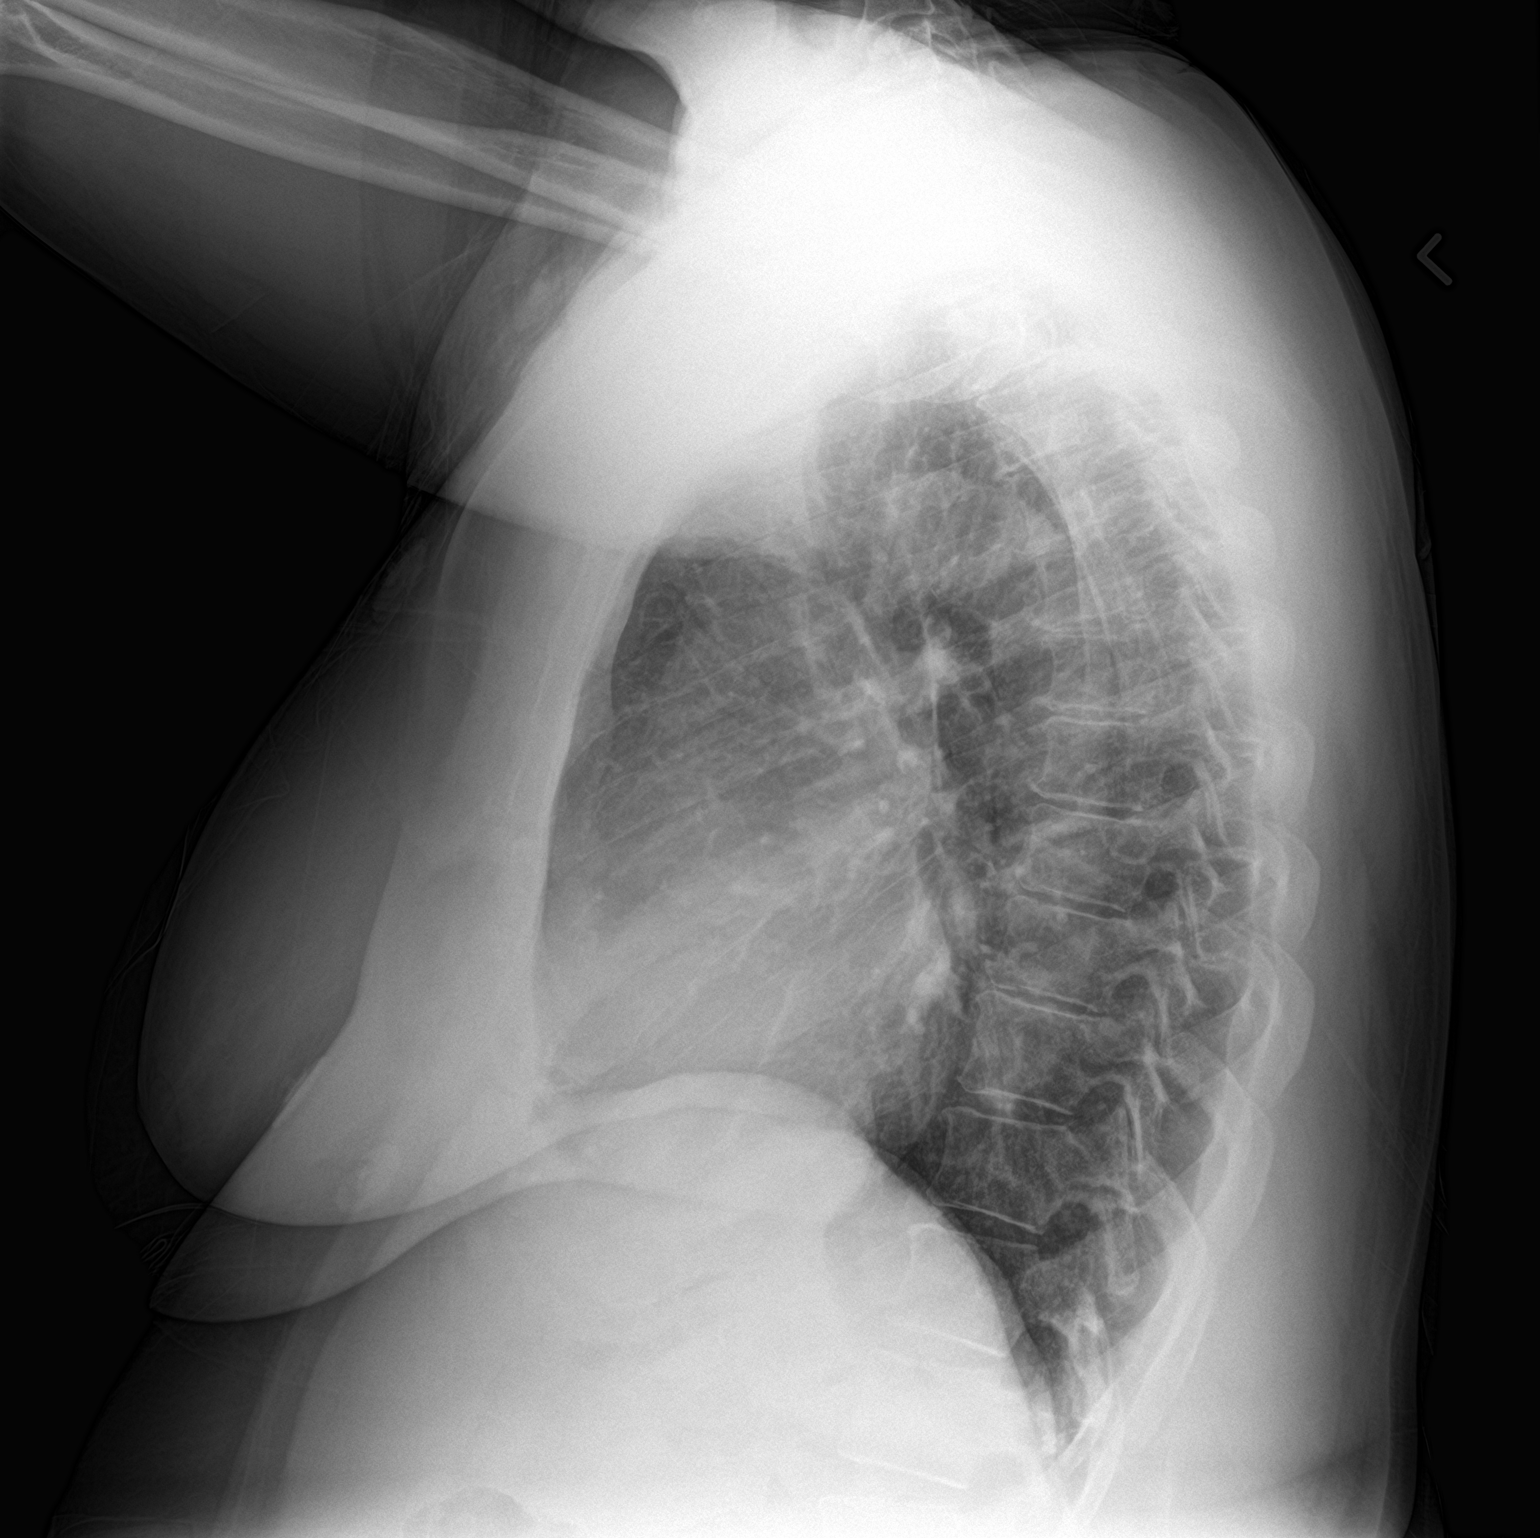

[2 of 2 positions shown; findings below may reference images not displayed]

FINDINGS: The heart size and mediastinal contours are within normal limits.
Both lungs are clear. The visualized skeletal structures are
unremarkable.
IMPRESSION: No active cardiopulmonary disease.

## 2022-06-08 ENCOUNTER — Other Ambulatory Visit: Payer: Self-pay | Admitting: Internal Medicine

## 2022-06-08 DIAGNOSIS — Z1231 Encounter for screening mammogram for malignant neoplasm of breast: Secondary | ICD-10-CM

## 2023-01-04 ENCOUNTER — Ambulatory Visit (HOSPITAL_COMMUNITY)
Admission: EM | Admit: 2023-01-04 | Discharge: 2023-01-04 | Disposition: A | Discharge status: Home / Self Care | Payer: No Typology Code available for payment source | Attending: Family Medicine | Admitting: Family Medicine

## 2023-01-04 ENCOUNTER — Encounter (HOSPITAL_COMMUNITY): Payer: Self-pay

## 2023-01-04 DIAGNOSIS — J4521 Mild intermittent asthma with (acute) exacerbation: Secondary | ICD-10-CM | POA: Diagnosis not present

## 2023-01-04 DIAGNOSIS — J069 Acute upper respiratory infection, unspecified: Secondary | ICD-10-CM

## 2023-01-04 LAB — POC INFLUENZA A AND B ANTIGEN (URGENT CARE ONLY)
INFLUENZA A ANTIGEN, POC: NEGATIVE
INFLUENZA B ANTIGEN, POC: NEGATIVE

## 2023-01-04 MED ORDER — PROMETHAZINE-DM 6.25-15 MG/5ML PO SYRP: 5.0000 mL | ORAL_SOLUTION | Freq: Four times a day (QID) | ORAL | 0 refills | Status: AC | PRN

## 2023-01-04 MED ORDER — PREDNISONE 20 MG PO TABS: 40.0000 mg | ORAL_TABLET | Freq: Every day | ORAL | 0 refills | Status: AC

## 2023-01-04 NOTE — ED Notes (Signed)
Obtained flu swab and placed in lab

## 2023-01-04 NOTE — ED Triage Notes (Signed)
Pt is here for cough, sob, body aches, neck pain, ears itchy, low energy  since yesterday, pt has been taking tylenol and Nyquil .

## 2023-01-05 NOTE — ED Provider Notes (Signed)
[place]   [id number] 01/04/23 Arrival Time: 1936  ASSESSMENT & PLAN:  1. Mild intermittent asthma with acute exacerbation   2. Viral URI with cough    No respiratory distress. Begin: Meds ordered this encounter  Medications   predniSONE (DELTASONE) 20 MG tablet    Sig: Take 2 tablets (40 mg total) by mouth daily.    Dispense:  10 tablet    Refill:  0   promethazine-dextromethorphan (PROMETHAZINE-DM) 6.25-15 MG/5ML syrup    Sig: Take 5 mLs by mouth 4 (four) times daily as needed for cough.    Dispense:  118 mL    Refill:  0   Results for orders placed or performed during the hospital encounter of 01/04/23  POC Influenza A & B Ag (Urgent Care)  Result Value Ref Range   INFLUENZA A ANTIGEN, POC NEGATIVE NEGATIVE   INFLUENZA B ANTIGEN, POC NEGATIVE NEGATIVE   Recommend:  Follow-up Information     Anson [place], MD.   Specialty: Family Medicine Why: As needed. Contact information: [address] [place] [id number] [phone]                 Reviewed expectations re: course of current medical issues. Questions answered. Outlined signs and symptoms indicating need for more acute intervention. Patient verbalized understanding. After Visit Summary given.  SUBJECTIVE: History from: patient.  Joy Chang is a 62 y.o. female who presents with complaint of cough/wheezing/body aches; abrupt onset; x 1 day. Denies fever. Is fatigued. Nyquil and Tylenol without much relief. Albuterol MDI when needed; temporarily helps wheezing.  Social History   Tobacco Use  Smoking Status Never  Smokeless Tobacco Never   OBJECTIVE:  Vitals:   01/04/23 2016  BP: (!) 166/78  Pulse: (!) 103  Resp: 16  Temp: 98.6 F (37 C)  TempSrc: Oral  SpO2: 95%    Recheck P: 94 BP noted  General appearance: alert; NAD HEENT: [place]; AT; with nasal congestion Neck: supple without LAD Cv: RRR without murmer Lungs: unlabored respirations, moderate bilateral  expiratory wheezing; cough: is present; no significant respiratory distress Skin: warm and dry Psychological: alert and cooperative; normal mood and affect    Allergies  Allergen Reactions   Aspartame And Phenylalanine    Lisinopril Cough   Sulfa Antibiotics Hives    Past Medical History:  Diagnosis Date   Asthma    Chronic back pain    Hypertension    Obesity    OSA (obstructive sleep apnea)    History reviewed. No pertinent family history. Social History   Socioeconomic History   Marital status: Legally Separated    Spouse name: Not on file   Number of children: 3   Years of education: Not on file   Highest education level: Not on file  Occupational History   Not on file  Tobacco Use   Smoking status: Never   Smokeless tobacco: Never  Substance and Sexual Activity   Alcohol use: No   Drug use: No   Sexual activity: Not on file  Other Topics Concern   Not on file  Social History Narrative   Not on file   Social Determinants of Health   Financial Resource Strain: Not on file  Food Insecurity: Not on file  Transportation Needs: Not on file  Physical Activity: Not on file  Stress: Not on file  Social Connections: Not on file  Intimate Partner Violence: Not on file  Vanessa Kick, MD 01/05/23 [phone]

## 2023-04-06 ENCOUNTER — Encounter: Payer: Self-pay | Admitting: Emergency Medicine

## 2023-04-06 ENCOUNTER — Other Ambulatory Visit: Payer: Self-pay

## 2023-04-06 ENCOUNTER — Emergency Department
Admission: EM | Admit: 2023-04-06 | Discharge: 2023-04-06 | Disposition: A | Discharge status: Home / Self Care | Payer: No Typology Code available for payment source | Attending: Emergency Medicine | Admitting: Emergency Medicine

## 2023-04-06 DIAGNOSIS — R42 Dizziness and giddiness: Secondary | ICD-10-CM | POA: Diagnosis present

## 2023-04-06 DIAGNOSIS — I10 Essential (primary) hypertension: Secondary | ICD-10-CM | POA: Insufficient documentation

## 2023-04-06 DIAGNOSIS — J45909 Unspecified asthma, uncomplicated: Secondary | ICD-10-CM | POA: Insufficient documentation

## 2023-04-06 LAB — CBC
HCT: 42.4 % (ref 36.0–46.0)
Hemoglobin: 13.5 g/dL (ref 12.0–15.0)
MCH: 26.6 pg (ref 26.0–34.0)
MCHC: 31.8 g/dL (ref 30.0–36.0)
MCV: 83.5 fL (ref 80.0–100.0)
Platelets: 224 10*3/uL (ref 150–400)
RBC: 5.08 MIL/uL (ref 3.87–5.11)
RDW: 14.9 % (ref 11.5–15.5)
WBC: 5.3 10*3/uL (ref 4.0–10.5)
nRBC: 0 % (ref 0.0–0.2)

## 2023-04-06 LAB — TROPONIN I (HIGH SENSITIVITY)
Troponin I (High Sensitivity): 6 ng/L (ref <18)
Troponin I (High Sensitivity): 6 ng/L (ref <18)

## 2023-04-06 LAB — BASIC METABOLIC PANEL
Anion gap: 5 (ref 5–15)
BUN: 25 mg/dL — ABNORMAL HIGH (ref 8–23)
CO2: 29 mmol/L (ref 22–32)
Calcium: 9.4 mg/dL (ref 8.9–10.3)
Chloride: 103 mmol/L (ref 98–111)
Creatinine, Ser: 0.88 mg/dL (ref 0.44–1.00)
GFR, Estimated: >60 mL/min (ref >60)
Glucose, Bld: 99 mg/dL (ref 70–99)
Potassium: 3.6 mmol/L (ref 3.5–5.1)
Sodium: 137 mmol/L (ref 135–145)

## 2023-04-06 MED ORDER — ONDANSETRON 4 MG PO TBDP
4.0000 mg | ORAL_TABLET | Freq: Once | ORAL | Status: AC
  Administered 2023-04-06: 4 mg via ORAL
  Filled 2023-04-06: qty 1

## 2023-04-06 NOTE — ED Triage Notes (Addendum)
Pt to ED for dizziness, nausea, sweats started an hour ago. Also reports generalized weakness.  Reports improvement to sx at this time

## 2023-04-06 NOTE — ED Provider Notes (Signed)
[facility] Emergency Department Provider Note     Event Date/Time   First MD Initiated Contact with Patient 04/06/23 1412     (approximate)   History   Dizziness   HPI  Joy Chang is a 62 y.o. female with a history of HTN, asthma, obesity, and OSA, presents to the ED for evaluation of dizziness, nausea, sweats with onset about an hour prior to arrival.  Patient also noted some generalized weakness at that time.  She presents to the ED for evaluation.  She reports that at the time of this evaluation, she had significant movement of her symptoms. She has self-discontinued her amlodipine since November.     Physical Exam   Triage Vital Signs: ED Triage Vitals  Enc Vitals Group     BP 04/06/23 1229 (!) 175/91     Pulse Rate 04/06/23 1229 69     Resp 04/06/23 1229 18     Temp 04/06/23 1230 98.3 F (36.8 C)     Temp src --      SpO2 04/06/23 1229 98 %     Weight 04/06/23 1230 228 lb (103.4 kg)     Height 04/06/23 1230 5\' 4"  (1.626 m)     Head Circumference --      Peak Flow --      Pain Score 04/06/23 1229 0     Pain Loc --      Pain Edu? --      Excl. in GC? --     Most recent vital signs: Vitals:   04/06/23 1229 04/06/23 1230  BP: (!) 175/91   Pulse: 69   Resp: 18   Temp:  98.3 F (36.8 C)  SpO2: 98%     General Awake, no distress. NAD HEENT NCAT. PERRL. EOMI. No rhinorrhea. Mucous membranes are moist.  CV:  Good peripheral perfusion.  RESP:  Normal effort.  ABD:  No distention.  NEURO: Cranial nerves II to XII grossly intact.   ED Results / Procedures / Treatments   Labs (all labs ordered are listed, but only abnormal results are displayed) Labs Reviewed  BASIC METABOLIC PANEL - Abnormal; Notable for the following components:      Result Value   BUN 25 (*)    All other components within normal limits  CBC  TROPONIN I (HIGH SENSITIVITY)  TROPONIN I (HIGH SENSITIVITY)     EKG  Vent. rate 65 BPM PR interval 224  ms QRS duration 86 ms QT/QTcB 400/416 ms P-R-T axes 68 -12 47 Sinus rhythm with 1st degree A-V block with occasional Premature ventricular complexes Minimal voltage criteria for LVH, may be normal variant ( Cornell product ) Borderline ECG When compared with ECG of 13-May-2021 18:12,  RADIOLOGY   No results found.   PROCEDURES:  Critical Care performed: No  Procedures   MEDICATIONS ORDERED IN ED: Medications  ondansetron (ZOFRAN-ODT) disintegrating tablet 4 mg (4 mg Oral Given 04/06/23 1514)     IMPRESSION / MDM / ASSESSMENT AND PLAN / ED COURSE  I reviewed the triage vital signs and the nursing notes.                              Differential diagnosis includes, but is not limited to,  ACS, aortic dissection, pulmonary embolism, cardiac tamponade, pneumothorax, pneumonia, pericarditis, myocarditis, GI-related causes including esophagitis/gastritis, and musculoskeletal chest wall pain.    Patient's presentation is most consistent with  acute complicated illness / injury requiring diagnostic workup.  Patient's diagnosis is consistent with symptomatic dizziness with associated nausea likely consistent with near syncopal episode.  Patient with reassuring exam and symptoms resolved at time of my evaluation with a normal cardiac workup.  Troponin is negative and EKG is without malignant arrhythmia.     ----------------------------------------- 3:38 PM on 04/06/2023 -----------------------------------------  Patient care was transferred to my colleague Gala Romney, he will await the serial troponin and disposition the patient accordingly.  FINAL CLINICAL IMPRESSION(S) / ED DIAGNOSES   Final diagnoses:  Dizziness     Rx / DC Orders   ED Discharge Orders     None        Note:  This document was prepared using Dragon voice recognition software and may include unintentional dictation errors.    Lissa Hoard, PA-C 04/10/23 1610    Pilar Jarvis,  MD 04/10/23 0930

## 2023-04-06 NOTE — ED Provider Notes (Signed)
-----------------------------------------   3:46 PM on 04/06/2023 -----------------------------------------  Blood pressure (!) 175/91, pulse 69, temperature 98.3 F (36.8 C), resp. rate 18, height 5\' 4"  (1.626 m), weight 103.4 kg, SpO2 98 %.  Assuming care from [facility], [place].  In short, Chasitty Hehl is a 62 y.o. female with a chief complaint of Dizziness .  Refer to the original H&P for additional details.  The current plan of care is to await return of second troponin.  Patient had a period of dizziness, nausea, sweating approximately an hour before she arrived in the ED.  This occurred right after she had had a large heavy meal.  Patient felt like this likely was secondary to this presented for evaluation.  No cardiac history.  Workup to this point had been reassuring, awaiting second troponin prior to discharge if this is reassuring.  Will manage patient if troponins are rising..   Troponin returns back at 6 which is consistent with first lab drawn.  Patient has no symptoms currently.  Given the reassuring workup with labs, EKG, troponins I feel that patient is stable for discharge.  Follow-up with primary care as needed.  Return precautions discussed with the patient.  ED diagnosis:  Dizziness   Alm Bustard Renold Don 04/06/23 1614    Pilar Jarvis, MD 04/06/23 Ebony Cargo

## 2024-03-20 ENCOUNTER — Other Ambulatory Visit: Payer: Self-pay

## 2024-03-20 NOTE — Patient Outreach (Signed)
 Aging Gracefully Program  03/20/2024  Joy Chang [birth year] [id number]   [facility] Evaluation Interviewer made contact with patient. Aging Gracefully survey completed.   Interviewer will send referral to RN and OT for follow up.    [name]  Population Health Care Management Assistant  Direct Dial: [phone]  Fax: [phone] Website: [email or web address]

## 2024-04-05 ENCOUNTER — Other Ambulatory Visit: Payer: Self-pay | Admitting: Rehabilitation

## 2024-04-06 NOTE — Patient Outreach (Signed)
 Aging Gracefully Program  OT Initial Visit  04/06/2024  Joy Chang [birth year] [id number]  Visit:  1- Initial Visit  Start Time:  1100 End Time:  1300 Total Minutes:  120  CCAP: Typical Daily Routine: Typical Daily Routine:: wake and takes medicine/apply lotions and knee braces. Some days do volunteer work, tend to garden, visit with family. Busy and engaged What Types Of Care Problems Are You Having Throughout The Day?: Pain and fatigue from pain. Difficulty getting in and out of the tub. Difficulty getting in and out of the car, hoping to get a higher seated car What Kind Of Help Do You Receive?: intermittent assist from family but is independent. Do You Think You Need Other Types Of Help?: none at this time What Do You Think Would Make Everyday Life Easier For You?: less pain What Is A Good Day Like?: able to engage with all activites, walk and be mobile What Is A Bad Day Like?: pain and fatigue Do You Have Time For Yourself?: yes Patient Reported Equipment: Patient Reported Equipment Currently Used: Occupational hygienist, Sports administrator, Sock Aid, Paediatric nurse, Single [employer] Functional Mobility-Walking Indoors/Getting Around the [employer]: Walking Indoors/Getting Around [employer]: A Little Difficulty Do You:: No Device/No Assistance Importance Of Learning New Strategies:: Very Much Observation: Walking Indoors/Getting Around The House: Independent With Pain, Difficulty, Or Use Of Device Safety: Moderate/Extreme Risk Efficiency: Somewhat Intervention: Yes Other Comments:: currently uses hand rail. But cannot bring rollator into the house as too difficult to lift. Functional Mobility-Walk A Block: Walk A Block: A Little Difficulty Do You:: Use A Device Functional Mobility-Maintain Balance While Showering: Maintaining Balance While Showering: A Little Difficulty Do You:: No Device/No Assistance Importance Of Learning New Strategies:: Very Much Observation: Maintain Balance While Showering:  N/O Safety: Moderate/Extreme Risk Efficiency: Somewhat Intervention: Yes Other Comments:: needs grab bars for stability of balance Functional Mobility-Stooping, Crouching, Kneeling To Retreive Item: Stooping, Crouching, or Kneeling To Retrieve Item: Moderate Difficulty Do You:: Use A Device Importance Of Learning New Strategies:: Not At All Other Comments:: uses a reacher Functional Mobility-Bending From Standing Position To Pick Up Clothing Off The Floor: Bending Over From Standing Position To Pick Up Clothing Off The Floor: No Difficulty Do You:: Use A Device Importance Of Learning New Strategies:: Not At All Functional Mobility-Reaching For Items Above Shoulder Level: Reaching For Items Above Shoulder Level: A Little Difficulty Do You:: Use A Device Importance Of Learning New Strategies:: Not At All Functional Mobility-Climb 1 Flight Of Stairs: Climb 1 Flight Of Stairs: A Lot Of Difficulty Do You:: No Device/No Assistance Importance Of Learning New Strategies:: Moderate Other Comments:: avoids this task Functional Mobility-Move In And Out Of Chair: Move In and Out Of A Chair: No Difficulty Do You:: No Device/No Assistance Importance Of Learning New Strategies:: Not At All Functional Mobility-Move In And Out Of Bed: Move In and Out Of Bed: Moderate Difficulty Do You:: No Device/No Assistance Importance Of Learning New Strategies:: Moderate Observation: Move In and Out Of Bed: Independent With Pain, Difficulty, Or Use Of Device Safety: A Little Risk Efficiency: Somewhat Intervention: Yes Other Comments:: can sit independently, sit to stand she leans on small end table against the wall for balance then uses the dresser Functional Mobility-Move In And Out Of Bath/Shower: Move In And Out Of A Bath/Shower: A Lot Of Difficulty Do You:: No Device/No Assistance Importance Of Learning New Strategies:: Very Much Observation: Move In And Out Of Bath/Shower: Independent With Pain,  Difficulty, Or Use Of Device Safety: Moderate/Extreme  Risk Efficiency: Not At All Intervention: Yes Other Comments:: client uses hands on the wall for stability to step out of the bath tub. Functional Mobility-Get On And Off Toilet: Getting Up From The Floor: Moderate Difficulty Do You:: No Device/No Assistance Importance Of Learning New Strategies:: Not At All Functional Mobility-Into And Out Of Car, Not Including Driving: Into  And Out Of Car, Not Including Driving: A Little Difficulty Do You:: No Device/No Assistance Importance Of Learning New Strategies:: Not At All Other Comments:: takes effort but can do independently Functional Mobility-Other Mobility Difficulty:      Activities of Daily Living-Bathing/Showering: ADL-Bathing/Showering: No Difficulty Do You:: No Device/No Assistance Importance Of Learning New Strategies: Not At All Activities of Daily Living-Personal Hygiene and Grooming: Personal Hygiene and Grooming: No Difficulty Do You:: No Device/No Assistance Importance Of Learning New Strategies: Not At All Activities of Daily Living-Toilet Hygiene: Toilet Hygiene: No Difficulty Do You:: No Device/No Assistance Importance Of Learning New Strategies: Not At All Activities of Daily Living-Put On And Take Off Undergarments (Incl. Fasteners): Put On And Take Off Undergarments (Incl. Fasteners): No Difficulty Do You:: No Device/No Assistance Importance Of Learning New Strategies: Not At All Activities of Daily Living-Put On And Take Off Shirt/Dress/Coat (Incl. Fasteners): Put On And Take Off Shirt/Dress/Coat (Incl. Fasteners): No Difficulty Do You:: No Device/No Assistance Importance Of Learning New Strategies: Not At All Activities of Daily Living-Put On And Take Off Socks And Shoes: Put On And Take Off Socks And  Shoes: No Difficulty Do You:: Use A Device Importance Of Learning New Strategies: Not At All Other Comments:: uses sock aid Activities of Daily  Living-Feed Self: Feed Self: No Difficulty Do You:: No Device/No Assistance Importance Of Learning New Strategies: Not At All Activities of Daily Living-Rest And Sleep: Rest and Sleep: No Difficulty Do You:: No Device/No Assistance Importance Of Learning New Strategies: Not At All Activities of Daily Living-Sexual Activity: Sexual  Activity: N/A Activities of Daily Living-Other Activity Identified:    Instrumental Activities of Daily Living-Light Homemaking (Laundry, Straightening Up, Vacuuming):  Do Light Homemaking (Laundry, Straightening Up, Vacuuming): No Difficulty Do You:: No Device/No Assistance Importance Of Learning New Strategies: Not At All Instrumental Activities of Daily Living-Making A Bed: Making a Bed: No Difficulty Do You:: No Device/No Assistance Importance Of Learning New Strategies: Not At All Instrumental Activities of Daily Living-Washing Dishes By Hand While Standing At The Sink: Washing Dishes By Hand While Standing At The Sink: A Little Difficulty Do You:: No Device/No Assistance Importance Of Learning New Strategies: A Little Other Comments:: uses a chair sometimes Instrumental Activities of Daily Living-Grocery Shopping: Do Grocery Shopping: No Difficulty Do You:: Use A Device Importance Of Learning New Strategies: Not At All Instrumental Activities of Daily Living-Use Telephone: Use Telephone: No Difficulty Do You:: No Device/No Assistance Importance Of Learning New Strategies: Not At All Instrumental Activities of Daily Living-Financial Management: Financial Management: No Difficulty Do You:: No Device/No Assistance Importance Of Learning New Strategies: Not At All Instrumental Activities of Daily Living-Medications: Take Medications: No Difficulty Do You:: No Device/No Assistance Importance Of Learning New Strategies: Not At All Instrumental Activities of Daily Living-Health Management And Maintenance: Health Management & Maintenance: No  Difficulty Do You:: No Device/No Assistance Importance Of Learning New Strategies: Not At All Instrumental Activities of Daily Living-Meal Preparation and Clean-Up: Meal Preparation and Clean-Up: No Difficulty Do You:: No Device/No Assistance Importance Of Learning New Strategies: Not At All Instrumental Activities of Daily Living-Provide Care For Others/Pets: Care For  Others/Pets: N/A Instrumental Activities of Daily Living-Take Part In Organized Social Activities: Take Part In Organized Social Activities: No Difficulty Do You:: Use A Device Importance Of Learning New Strategies: Not At All Other Comments:: uses rollator Instrumental Activities of Daily Living-Leisure Participation: Leisure Participation: No Difficulty Do You:: Use A Device Importance Of Learning New Strategies: Not At All Instrumental Activities of Daily Living-Employment/Volunteer Activities: Employment/Volunteer Activities: No Difficulty Do You:: Use A Device Importance Of Learning New Strategies: Not At All Other Comments:: rollator Instrumental Activities of Daily Living-Other Identifies:    Readiness To Change Score:  Readiness to Change Score: 9.67  Home Environment Assessment: Outside Home Entry:: 3 steps with hand rail to enter side of house (main entrance for client) Cherylann:: has an Solicitor used to assist balance as walking. broken seam in vinyl flooring is tripping hazard Stairs:: non inside the house Bathroom:: bedroom bathroom: shower stall with one step in. has small shower chair and hand held wand. No grab bars. Shower currently not in use as drip pan is a concern and stability of that area. low toilet seat, uses wall and vanity to assist sit to stand. Hall bathroom does not have grab bars. Uses bathtub to step in and out. Has non skid surface bottom of tub. This toilet is comfort height Master Bedroom:: bed is on wooden platform with slats. Laundry:: in Banker:: yes Mailbox::  at Mudlogger: Durable Medical Equipment: Agricultural consultant, Engineer, maintenance (IT): Sports administrator, Sock Aid, [employer]  Patient Education: Education Provided: Yes Education Details: [employer], [employer], Check for Film/video editor) Educated: Patient Comprehension: Verbalized Understanding  Goals:  Goals Addressed             This Visit's Progress    AG OT Patient Stated       Improve safety and mobility in and out of the house.     AG OT Patient Stated       Improve safety and mobility in the bathrooms     AG OT Patient Stated       Safety and mobility walking in the house     AG OT Patient Stated       Mobility in and out of the bed        Post Clinical Reasoning: Clinician View Of Client Situation:: Client is aware and engaged with her heatlh care. Verbalizes she is wanting to ready her house for more safety to support staying here safely Client View Of His/Her Situation:: Client is motivated and seeking advice to keep her safe and able to age in her home. Works with the [employer] and has excellent medical support Next Visit Plan:: Aug 1 visit: review goals and plan, how to get up from a fall handout/education, assess bed rail

## 2024-04-08 ENCOUNTER — Other Ambulatory Visit: Payer: Self-pay | Admitting: *Deleted

## 2024-04-08 NOTE — Patient Outreach (Signed)
 Aging Gracefully Program  04/08/2024  Hamna Turnage 04-18-1961 [id number]   Ms. Orlando returned writer's call to schedule first AG RN home visit. Visit scheduled for July 10th at 9 am. Clinical research associate provided contact information.  Pablo Hurst, MSN, RN, BSN [place]  [facility], [facility] RN Case Manager for Aging Gracefully Direct Dial: [phone]

## 2024-04-17 ENCOUNTER — Other Ambulatory Visit: Payer: Self-pay | Admitting: *Deleted

## 2024-04-17 NOTE — Patient Outreach (Signed)
 Aging Gracefully Program  04/17/2024  Leroy Ricard [birth year] [id number]  Telephone call made to Ms. Orlando to [employer] RN home visit for Thursday, July 17th at 9am.   Pablo Hurst, MSN, RN, BSN [place]  [facility], [facility] RN Case Manager for Aging Gracefully Direct Dial: [phone]

## 2024-04-18 ENCOUNTER — Encounter: Payer: Self-pay | Admitting: *Deleted

## 2024-04-25 ENCOUNTER — Encounter: Payer: Self-pay | Admitting: *Deleted

## 2024-04-26 ENCOUNTER — Other Ambulatory Visit: Payer: Self-pay | Admitting: *Deleted

## 2024-04-26 NOTE — Patient Outreach (Signed)
 Aging Gracefully Program  04/26/2024  Joy Chang 1961/03/19 [id number]  Late entry 04/25/24: Writer cancelled AG RN home visit with Joy Chang due to an emergency.  04/26/24: Call made to Joy Chang to reschedule AG RN home visit. Joy Chang agreeable to rescheduled appointment for July 25th at 930 am    Joy Hurst, MSN, Charity fundraiser, BSN [place]  [facility], [facility] RN Case Production designer, theatre/television/film for Aging Engineer, drilling Dial: [phone]

## 2024-05-03 ENCOUNTER — Other Ambulatory Visit: Payer: Self-pay | Admitting: *Deleted

## 2024-05-03 ENCOUNTER — Encounter: Payer: Self-pay | Admitting: *Deleted

## 2024-05-03 NOTE — Patient Outreach (Signed)
 Aging Gracefully Program  RN Visit  05/03/2024  Joy Chang [birth year] [id number]  Visit:  RN Visit Number: 1- Initial Visit  RN TIME CALCULATION: Start TIme:  RN Start Time Calculation: 0930 End Time:  RN Stop Time Calculation: 1200 Total Minutes:  RN Time Calculation: 150  Readiness To Change Score:  Readiness to Change Score: 10  Universal RN Interventions: Calendar Distribution: Yes Exercise Review: No Medications: Yes Medication Changes: No Mood: Yes Pain: Yes PCP Advocacy/Support: No Fall Prevention: Yes Incontinence: Yes Clinician View Of Client Situation: Arrived for home visit. [name] answered door with warm and welcoming demeanor. Home neat, clean, and without clutter. Client View Of His/Her Situation: [name] reports pain 3 out of 10. Denies any concerns. States she is strong in her faith and stays active in her church.  Healthcare Provider Communication: Did Surveyor, mining With [employer] Provider?: No Healthcare Provider Response According to RN: n/a According to Client, Did PCP Report Communication With An Aging Gracefully RN?: No Healthcare Provider Response According To Client: n/a  Clinician View of Client Situation: Clinician View Of Client Situation: Arrived for home visit. [name] answered door with warm and welcoming demeanor. Home neat, clean, and without clutter. Client's View of His/Her Situation: Client View Of His/Her Situation: [name] reports pain 3 out of 10. Denies any concerns. States she is strong in her faith and stays active in her church.  Medication Assessment: Do You Have Any Problems Paying For Medications?: No Where Does Client Store Medications?: Other: (bedroom in chest) Can Client Read Pill Bottles?: Yes Does Client Use A Pillbox?: Yes Does Anyone Assist Client In Filling Pillbox?: No Does Anyone Assist Client In Taking Medications?: No Do You Take Vitamin D?: Yes Does Client Have Any Questions Or  Concerns About Medictions?: No Is Client Complaining Of Any Symptoms That Could Be Side Effects To Medications?: No Any Possible Changes In Medication Regimen?: No  OT Update: Pending [facility]S contracts and assessments.  Session Summary: [name] is engaging and receptive during visit. Denies any recent falls. Remains active and is enjoying retirement.   Goals Addressed               This Visit's Progress     AG RN (pt-stated)        05/03/2024  Assessment: [name] denies any recent falls. States she had 3 falls at the beginning of the year without injury. Reports recent home floors repaired. States the leveled floors have helped tremendously. Uses rollator outside the home and cane inside the home as needed. Rates knee pain score of 3. Reports chronic knee and back pain. 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 Has not had elected to have surgery. [name] remains active with church activities, grandchildren, and gardening. [name] reports upcoming outpatient therapy appointment on Monday 05/06/24.  Interventions: Provided chronic disease management booklet and writer's contact information. Discussed fall prevention. Encouraged [name] to [facility] with [facility] about potential medical alert system discounts or benefit. Encouraged [name] to carry phone with her at all times in the home for safety reasons.  Plan: Scheduled next home visit August 28th at 1 pm.   CLIENT/RN ACTION PLAN - FALL PREVENTION  Registered Nurse:  Pablo Hurst Date:  05/03/24  Client Name: Joy Chang Client ID:    Target Area:  FALL PREVENTION    Why Problem May Occur: Moving too quickly    Target Goal: No falls over the next 160 days  STRATEGIES Coping Strategies Ideas  Change Positions Slowly: lying to sitting, sitting to standing Reviewed 05/03/24 Changing positions can make people lightheaded. When getting up in the morning sit for a few minutes, before standing.  Stand for a  few minutes before walking and hold on to sturdy furniture or countertop.    Drink water Reviewed 05/03/24 Dehydration can make people dizzy. Ask your healthcare provider how much water you can drink. Decrease caffeine, caffeine makes you urinate a lot, which can make you dehydrated.   If you fall, tell someone Reviewed 05/03/24 Tell a friend or family member even if you didn't get hurt. Tell your healthcare provider if you fall.  They can help you figure out why.   Get your vision and hearing checked Reviewed 05/03/24 Poor vision and hearing loss can make people fall. Glaucoma and diabetes can cause poor vision.   Other    Prevention Ideas  Review your Medicines Reviewed 05/03/24 Many medicines can make you dizzy or sleepy and increase your risk of falling. Your Aging Gracefully Nurse will look at your medications and see if you are taking any that might cause falling.   Activity and Exercise Reviewed 05/03/24 Aging Gracefully exercises Walking (inside or outside) [employer]:  cooking, Education officer, environmental, laundry Exercise while watching TV Swimming or water aerobics.   Strengthen Bones Reviewed 05/03/24 Exercise makes your bones stronger. Vitamin D and Calcium make your bones stronger.  Ask your Healthcare provider if you should be taking them. Your body makes vitamin D from the sun.  Sit in the sun for 10 minutes every day (without sunscreen).   Control your urine  Reviewed 05/03/24 Prevent constipation Cut back on caffeinated drinks. Don't wait to urinate. If diabetic, control your blood sugar. Ask your Aging Gracefully Nurse and Healthcare Provider  about urine control.   Control your blood sugar (if you are diabetic) Reviewed 05/03/24 High blood sugar can cause frequent urination, poor vision, and numbness in your feet, which can make you fall.   Low blood sugar can cause confusion and dizziness.   Other coping strategies 1. 2. 3. 4. 5.   PRACTICE It is  important to practice the strategies so we can determine if they will be effective in helping to reach the goal.    Follow these specific recommendations:        If strategy does not work the first time, try it again.     We may make some changes over the next few sessions.      Pablo Hurst, MSN, RN, BSN [place]  [facility], Healthy Communities RN Case Manager for Aging Gracefully Direct Dial: [phone]                                    Patient Stated

## 2024-05-03 NOTE — Patient Instructions (Signed)
 Visit Information  Thank you for taking time to visit with me today. Please don't hesitate to contact me if I can be of assistance to you before our next scheduled home appointment.  Following are the goals we discussed today:   Goals Addressed               This Visit's Progress     AG RN (pt-stated)        05/03/2024  Assessment: Ms. Graig denies any recent falls. States she had 3 falls at the beginning of the year without injury. Reports recent home floors repaired. States the leveled floors have helped tremendously. Uses rollator outside the home and cane inside the home as needed. Rates knee pain score of 3. Reports chronic knee and back pain. 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 Has not had elected to have surgery. Ms. Dino remains active with church activities, grandchildren, and gardening. Ms. Mielke reports upcoming outpatient therapy appointment on Monday 05/06/24.  Interventions: Provided chronic disease management booklet and writer's contact information. Discussed fall prevention. Encouraged Ms. Kosel to inquire with the [facility] about potential medical alert system discounts or benefit. Encouraged Ms. Ribble to carry phone with her at all times in the home for safety reasons.  Plan: Scheduled next home visit August 28th at 1 pm.   CLIENT/RN ACTION PLAN - FALL PREVENTION  Registered Nurse:  Pablo Hurst Date:  05/03/24  Client Name: Haze [place] Client ID:    Target Area:  FALL PREVENTION    Why Problem May Occur: Moving too quickly    Target Goal: No falls over the next 160 days   STRATEGIES Coping Strategies Ideas  Change Positions Slowly: lying to sitting, sitting to standing Reviewed 05/03/24 Changing positions can make people lightheaded. When getting up in the morning sit for a few minutes, before standing.  Stand for a few minutes before walking and hold on to sturdy furniture or countertop.    Drink water Reviewed 05/03/24 Dehydration can make  people dizzy. Ask your healthcare provider how much water you can drink. Decrease caffeine, caffeine makes you urinate a lot, which can make you dehydrated.   If you fall, tell someone Reviewed 05/03/24 Tell a friend or family member even if you didn't get hurt. Tell your healthcare provider if you fall.  They can help you figure out why.   Get your vision and hearing checked Reviewed 05/03/24 Poor vision and hearing loss can make people fall. Glaucoma and diabetes can cause poor vision.   Other    Prevention Ideas  Review your Medicines Reviewed 05/03/24 Many medicines can make you dizzy or sleepy and increase your risk of falling. Your Aging Gracefully Nurse will look at your medications and see if you are taking any that might cause falling.   Activity and Exercise Reviewed 05/03/24 Aging Gracefully exercises Walking (inside or outside) [employer]:  cooking, Education officer, environmental, laundry Exercise while watching TV Swimming or water aerobics.   Strengthen Bones Reviewed 05/03/24 Exercise makes your bones stronger. Vitamin D and Calcium make your bones stronger.  Ask your Healthcare provider if you should be taking them. Your body makes vitamin D from the sun.  Sit in the sun for 10 minutes every day (without sunscreen).   Control your urine  Reviewed 05/03/24 Prevent constipation Cut back on caffeinated drinks. Don't wait to urinate. If diabetic, control your blood sugar. Ask your Aging Gracefully Nurse and Healthcare Provider  about urine control.   Control your blood  sugar (if you are diabetic) Reviewed 05/03/24 High blood sugar can cause frequent urination, poor vision, and numbness in your feet, which can make you fall.   Low blood sugar can cause confusion and dizziness.   Other coping strategies 1. 2. 3. 4. 5.   PRACTICE It is important to practice the strategies so we can determine if they will be effective in helping to reach the goal.    Follow these  specific recommendations:        If strategy does not work the first time, try it again.     We may make some changes over the next few sessions.      Pablo Hurst, MSN, RN, BSN [place]  [facility], Healthy Communities RN Case Manager for Aging Gracefully Direct Dial: [phone]                                    Patient Stated           Our next appointment is on August 28 at 1pm.  If you are experiencing a Mental Health or Behavioral Health Crisis or need someone to talk to, please call the Suicide and Crisis Lifeline: 988 call the [place]  National Suicide Prevention Lifeline: 360-842-4023 or TTY: 2070215352 TTY 507 582 1882) to talk to a trained counselor call 1-800-273-TALK (toll free, 24 hour hotline) go to [facility] [address] [place] [phone]) call 911   The patient verbalized understanding of instructions, educational materials, and care plan provided today and agreed to receive a mailed copy of patient instructions, educational materials, and care plan.   Pablo Hurst, MSN, RN, BSN [place]  [facility], Healthy Communities RN Case Manager for Aging Gracefully Direct Dial: [phone]

## 2024-05-10 ENCOUNTER — Other Ambulatory Visit: Payer: Self-pay | Admitting: Rehabilitation

## 2024-05-11 NOTE — Patient Outreach (Signed)
 Aging Gracefully Program  OT Follow-Up Visit  05/11/2024  Joy Chang January 17, 1961 [id number]  Visit:  2- Second Visit  Start Time:  1400 End Time:  1450 Total Minutes:  50  Readiness to Change Score :  Readiness to Change Score: 10  Patient Education:  Discussion about HISA grant from [place], will ask [employer] [facility]) to call as this impacts the quote.   Goals:   Goals Addressed             This Visit's Progress    AG OT Patient Stated   On track    Improve safety and mobility in and out of the house. 05/10/24: awaiting construction of a ramp.     AG OT Patient Stated       Improve safety and mobility in the bathrooms 05/10/24: can apply for the [facility] HISA grant and may now consider a walk in shower front bathroom. Will monitor progress with the grant.      AG OT Patient Stated   On track    Safety and mobility walking in the house 05/10/24: client removed her floor mat in front of the sink. No further falls in weeks. Kitchen floor to be improved with [facility], Audiological scientist.    OT ACTION PLAN: Functional Mobility  Target Problem Area:  Tripping hazards in the house   Why Problem May Occur:     Uneven flooring  Mats on floor Muscle weakness       Target Goal(s):  Safety and mobility walking in the house     STRATEGIES   Saving Your Energy DO:  Take breaks  Raise the height of surfaces   Remove tripping hazards  (Other):pause after standing   (Other):continue leg exercises   (Other):     Modifying your home environment and making it safe    DO:  Install grab bars in the bathroom (CHS)  Remove or strongly secure throw rugs (completed in kitchen)  (Other): Kitchen flooring seam to be addressed ([facility])     Simplifying the way you set up tasks or daily routines DO:  Move slowly, Pause after standing   (Other):Take breaks before feeling tired   (Other):           AG OT Patient Stated   On track    Mobility in and out of the  bed 05/10/24: client reports improved strength from her exercises/Cubbi machine. When getting out of bed, she is now able to stand and pause, then hold onto dresser without having to flex forward to hold the low table. Does not feel a bed handrail is needed at this time. OT will monitor next visit         Post Clinical Reasoning: Client Action (Goal) One Interventions: Improve safety and mobility in and out of the house. 05/10/24: being assessed for a ramp- ongoing goal Did Client Try?: Yes Targeted Problem Area Status: The Same Client Action (Goal) Two Interventions: Improve safety and mobility in bathrooms. 05/10/24: review recommendations, placement of grab bars if choosing a walk in shower in front bathroom. Awaiting home modifications Did Client Try?: No Client Action (Goal) Three Interventions: Safety and mobility walking in the house. 05/10/24: client removed mat in front of the sink. No falls in last month. Awaiting flooring improvement in kitchen. Did Client Try?: Yes Targeted Problem Area Status: A Little Better Client Action (Goal) Four Interventions: improve mobility in and out of bed. 05/10/24: client reports with improved strength from her exercises/Cubbi machine she  is now able to stand and pause, then hold onto dresser without having to flex forward to hold the low table. Does not feel a bed handrail is needed at this time. OT will monitor next visit Clinician View Of Client Situation:: Client is excited about her health improvements and was recetly told she can apply for the HISA grant through the [place]. Continues to exercise using her floor Cubbi machine. Reports being more steady and stronger. Client also verbalizes how she is improving sit to stand from her bed and feels more stable, no longer feels she needs a bed rail arm rest Client View Of His/Her Situation:: Client recognizes her improvements and what more needs to happen. She is waiting for paperwork through the mail (only option) to  apply for the [employer] which covers a ramp and bathroom remodel.  Client is thinkning through the options and is excited for the improvements to come. Next Visit Plan:: review How to get up from a fall, not completed visit 2. Check transfer out of bed, follow up work order needs. Energy Conservation handout

## 2024-06-06 ENCOUNTER — Other Ambulatory Visit: Payer: Self-pay | Admitting: *Deleted

## 2024-06-07 NOTE — Patient Outreach (Signed)
 Aging Gracefully Program  RN Visit  06/07/2024  Joy Chang [birth year] [id number]  Visit:  RN Visit Number: 2- Second Visit  RN TIME CALCULATION: Start TIme:  RN Start Time Calculation: 1300 End Time:  RN Stop Time Calculation: 1415 Total Minutes:  RN Time Calculation: 75  Readiness To Change Score:  Readiness to Change Score: 10  Universal RN Interventions: Calendar Distribution: No Exercise Review: Yes Medications: Yes Medication Changes: No Mood: Yes Pain: Yes PCP Advocacy/Support: No Fall Prevention: Yes Incontinence: Yes Clinician View Of Client Situation: Arrived for home visit. Joy Chang was watering her garden upon writer's visit. Kitchen area clean and neat. Joy Chang ambulates independently without assistive device. Client View Of His/Her Situation: Joy Chang reports she is doing well overall. She has not had any falls lately. Reports having a slight cold recently. States VA grant paperwork has been submitted. She is waiting to hear back on start date. VA grant home modifications will not interfere with CHS.  Healthcare Provider Communication: Did Surveyor, mining With [employer] Provider?: No Healthcare Provider Response According to RN: N/A According to Client, Did PCP Report Communication With An Aging Gracefully RN?: No Healthcare Provider Response According To Client: N/A  Clinician View of Client Situation: Clinician View Of Client Situation: Arrived for home visit. Ms. Torry was watering her garden upon writer's visit. Kitchen area clean and neat. Ms. Foulkes ambulates independently without assistive device. Client's View of His/Her Situation: Client View Of His/Her Situation: Joy Chang reports she is doing well overall. She has not had any falls lately. Reports having a slight cold recently. States VA grant paperwork has been submitted. She is waiting to hear back on start date. VA grant home modifications will not interfere with  CHS.  Medication Assessment: reviewed    OT Update: Pending CHS contracts and assessments  Session Summary: [name] is engaging and is diligent about her health care needs.   Goals Addressed               This Visit's Progress     AG RN (pt-stated)        05/03/2024  Assessment: Joy Chang denies any recent falls. States she had 3 falls at the beginning of the year without injury. Reports recent home floors repaired. States the leveled floors have helped tremendously. Uses rollator outside the home and cane inside the home as needed. Rates knee pain score of 3. Reports chronic knee and back pain. 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 Has not had elected to have surgery. Joy Chang remains active with church activities, grandchildren, and gardening. Joy Chang reports upcoming outpatient therapy appointment on Monday 05/06/24.  Interventions: Provided chronic disease management booklet and writer's contact information. Discussed fall prevention. Encouraged Joy Chang to inquire with the VA about potential medical alert system discounts or benefit. Encouraged Joy Chang to carry phone with her at all times in the home for safety reasons.  Plan: Scheduled next home visit August 28th at 1 pm.   CLIENT/RN ACTION PLAN - FALL PREVENTION  Registered Nurse:  Pablo Hurst Date:  05/03/24  Client Name: Joy Chang Client ID:    Target Area:  FALL PREVENTION    Why Problem May Occur: Moving too quickly    Target Goal: No falls over the next 160 days   STRATEGIES Coping Strategies Ideas  Change Positions Slowly: lying to sitting, sitting to standing Reviewed 05/03/24 Reviewed 06/06/24  Changing positions can make people lightheaded. When getting up in the  morning sit for a few minutes, before standing.  Stand for a few minutes before walking and hold on to sturdy furniture or countertop.    Drink water Reviewed 05/03/24 Reviewed 06/06/24 Dehydration can make people  dizzy. Ask your healthcare provider how much water you can drink. Decrease caffeine, caffeine makes you urinate a lot, which can make you dehydrated.   If you fall, tell someone Reviewed 05/03/24 Reviewed 06/06/24 Tell a friend or family member even if you didn't get hurt. Tell your healthcare provider if you fall.  They can help you figure out why.   Get your vision and hearing checked Reviewed 05/03/24 Reviewed 06/06/24 Poor vision and hearing loss can make people fall. Glaucoma and diabetes can cause poor vision.   Other    Prevention Ideas  Review your Medicines Reviewed 05/03/24 Reviewed 06/06/24 Many medicines can make you dizzy or sleepy and increase your risk of falling. Your Aging Gracefully Nurse will look at your medications and see if you are taking any that might cause falling.   Activity and Exercise Reviewed 05/03/24 Reviewed 06/06/24 Aging Gracefully exercises Walking (inside or outside) [employer]:  cooking, Education officer, environmental, laundry Exercise while watching TV Swimming or water aerobics.   Strengthen Bones Reviewed 05/03/24 Reviewed 06/06/24 Exercise makes your bones stronger. Vitamin D and Calcium make your bones stronger.  Ask your Healthcare provider if you should be taking them. Your body makes vitamin D from the sun.  Sit in the sun for 10 minutes every day (without sunscreen).   Control your urine  Reviewed 05/03/24 Reviewed 06/06/24 Prevent constipation Cut back on caffeinated drinks. Don't wait to urinate. If diabetic, control your blood sugar. Ask your Aging Gracefully Nurse and Healthcare Provider  about urine control.   Control your blood sugar (if you are diabetic) Reviewed 05/03/24 Reviewed 06/06/24 High blood sugar can cause frequent urination, poor vision, and numbness in your feet, which can make you fall.   Low blood sugar can cause confusion and dizziness.   Other coping strategies 1. 2. 3. 4. 5.   PRACTICE It is important to  practice the strategies so we can determine if they will be effective in helping to reach the goal.    Follow these specific recommendations:        If strategy does not work the first time, try it again.     We may make some changes over the next few sessions.      Pablo Hurst, MSN, RN, BSN [facility], Healthy Communities RN Case Manager for Aging Gracefully Direct Dial: [phone]     06/07/24  Assessment: Ms. Nishi denies any recent falls. Reports knee pain 0 out of 10 at rest during visit. Reports having more strength in LE lately. Endorses remaining active and performs home exercises regularly. Uses rollator when out in the community. Has walnut tree near her driveway. Uses caution when walking on the drive way. Home modifications by [facility] grant has not started yet. States she submitted the rest of the paperwork on 05/29/24 and they have 30 days to process application. However, Ms. Woodside states she plans to follow up with VA on 06/17/24 for updated status. States she wants to move forward with CHS repairs.   Interventions: Demonstrated AG home exercises. Ms. Ranney performed return demonstration. AG home exercise booklet provided. Discussed fall precautions. Discussed medical alert system needs. Provided information regarding affordable medical alert system options. Encouraged Ms. Lambrecht to continue to use precautions when walking  outside near the walnut tree. Encouraged Ms. Weltman to use rollator when walking outside, around her home, for additional safety. Discussed Clinical research associate will update AG team about pending VA work and that Ms. Stanly wants to proceed with CHS modifications.  Plan: Will update AG team. Scheduled next home visit for Thursday, Oct.2nd at 1pm.    Pablo Hurst, MSN, RN, BSN [place]  [facility], Healthy Communities RN Case Manager for Aging Gracefully Direct Dial: [phone]                                                       Pablo Hurst, MSN, RN, BSN [place]  [facility], Healthy Communities RN Case Manager for Aging Gracefully Direct Dial: [phone]

## 2024-06-07 NOTE — Patient Instructions (Signed)
 Visit Information  Thank you for taking time to visit with me today. Please don't hesitate to contact me if I can be of assistance to you before our next scheduled home appointment.  Following are the goals we discussed today:   Goals Addressed               This Visit's Progress     AG RN (pt-stated)        05/03/2024  Assessment: Joy Chang denies any recent falls. States she had 3 falls at the beginning of the year without injury. Reports recent home floors repaired. States the leveled floors have helped tremendously. Uses rollator outside the home and cane inside the home as needed. Rates knee pain score of 3. Reports chronic knee and back pain. 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 Has not had elected to have surgery. Joy Chang remains active with church activities, grandchildren, and gardening. Joy Chang reports upcoming outpatient therapy appointment on Monday 05/06/24.  Interventions: Provided chronic disease management booklet and writer's contact information. Discussed fall prevention. Encouraged [facility] to [facility] about potential [facility] alert system discounts or benefit. Encouraged Joy Chang to carry phone with her at all times in the home for safety reasons.  Plan: Scheduled next home visit August 28th at 1 pm.   CLIENT/RN ACTION PLAN - FALL PREVENTION  Registered Nurse:  Pablo Hurst Date:  05/03/24  Client Name: Joy Chang Client ID:    Target Area:  FALL PREVENTION    Why Problem May Occur: Moving too quickly    Target Goal: No falls over the next 160 days   STRATEGIES Coping Strategies Ideas  Change Positions Slowly: lying to sitting, sitting to standing Reviewed 05/03/24 Reviewed 06/06/24  Changing positions can make people lightheaded. When getting up in the morning sit for a few minutes, before standing.  Stand for a few minutes before walking and hold on to sturdy furniture or countertop.    Drink water Reviewed  05/03/24 Reviewed 06/06/24 Dehydration can make people dizzy. Ask your healthcare provider how much water you can drink. Decrease caffeine, caffeine makes you urinate a lot, which can make you dehydrated.   If you fall, tell someone Reviewed 05/03/24 Reviewed 06/06/24 Tell a friend or family member even if you didn't get hurt. Tell your healthcare provider if you fall.  They can help you figure out why.   Get your vision and hearing checked Reviewed 05/03/24 Reviewed 06/06/24 Poor vision and hearing loss can make people fall. Glaucoma and diabetes can cause poor vision.   Other    Prevention Ideas  Review your Medicines Reviewed 05/03/24 Reviewed 06/06/24 Many medicines can make you dizzy or sleepy and increase your risk of falling. Your Aging Gracefully Nurse will look at your medications and see if you are taking any that might cause falling.   Activity and Exercise Reviewed 05/03/24 Reviewed 06/06/24 Aging Gracefully exercises Walking (inside or outside) [employer]:  cooking, Education officer, environmental, laundry Exercise while watching TV Swimming or water aerobics.   Strengthen Bones Reviewed 05/03/24 Reviewed 06/06/24 Exercise makes your bones stronger. Vitamin D and Calcium make your bones stronger.  Ask your Healthcare provider if you should be taking them. Your body makes vitamin D from the sun.  Sit in the sun for 10 minutes every day (without sunscreen).   Control your urine  Reviewed 05/03/24 Reviewed 06/06/24 Prevent constipation Cut back on caffeinated drinks. Don't wait to urinate. If diabetic, control your blood sugar.  Ask your Aging Gracefully Nurse and Healthcare Provider  about urine control.   Control your blood sugar (if you are diabetic) Reviewed 05/03/24 Reviewed 06/06/24 High blood sugar can cause frequent urination, poor vision, and numbness in your feet, which can make you fall.   Low blood sugar can cause confusion and dizziness.   Other coping strategies  1. 2. 3. 4. 5.   PRACTICE It is important to practice the strategies so we can determine if they will be effective in helping to reach the goal.    Follow these specific recommendations:        If strategy does not work the first time, try it again.     We may make some changes over the next few sessions.      Pablo Hurst, MSN, RN, BSN [facility], Healthy Communities RN Case Manager for Aging Gracefully Direct Dial: [phone]     06/07/24  Assessment: Joy Chang denies any recent falls. Reports knee pain 0 out of 10 at rest during visit. Reports having more strength in LE lately. Endorses remaining active and performs home exercises regularly. Uses rollator when out in the community. Has walnut tree near her driveway. Uses caution when walking on the drive way. Home modifications by [employer] grant has not started yet. States she submitted the rest of the paperwork on 05/29/24 and they have 30 days to process application. However, Joy Chang states she plans to [facility] up with [facility] on 06/17/24 for updated status. States she wants to move forward with CHS repairs.   Interventions: Demonstrated AG home exercises. Joy Chang performed return demonstration. AG home exercise booklet provided. Discussed fall precautions. Discussed medical alert system needs. Provided information regarding affordable medical alert system options. Encouraged Joy Chang to continue to use precautions when walking outside near the walnut tree. Encouraged Joy Chang to use rollator when walking outside, around her home, for additional safety. Discussed Clinical research associate will update AG team about pending [facility] work and that Joy Chang wants to proceed with CHS modifications.  Plan: Will update AG team. Scheduled next home visit for Thursday, Oct.2nd at 1pm.    Pablo Hurst, MSN, RN, BSN [place]  [facility], Healthy Communities RN Case Manager for Aging Gracefully Direct  Dial: [phone]                                                         Our next appointment is on Oct. 2nd at 1pm.  If you are experiencing a Mental Health or Behavioral Health Crisis or need someone to talk to, please call the Suicide and Crisis Lifeline: 988 call the [place]  National Suicide Prevention Lifeline: 223-303-6838 or TTY: (650)552-0191 TTY 680-361-3181) to talk to a trained counselor call 1-800-273-TALK (toll free, 24 hour hotline) go to [facility] [address] [place] [phone]) call 911   The patient verbalized understanding of instructions, educational materials, and care plan provided today and agreed to receive a mailed copy of patient instructions, educational materials, and care plan.   Pablo Hurst, MSN, RN, BSN [place]  [facility], Healthy Communities RN Case Manager for Aging Gracefully Direct Dial: [phone]

## 2024-07-11 ENCOUNTER — Encounter: Payer: Self-pay | Admitting: *Deleted

## 2024-07-11 ENCOUNTER — Other Ambulatory Visit: Payer: Self-pay | Admitting: *Deleted

## 2024-07-11 ENCOUNTER — Other Ambulatory Visit: Admitting: *Deleted

## 2024-07-11 NOTE — Patient Outreach (Signed)
 Aging Gracefully Program  RN Visit  07/11/2024  Joy Chang 1961-01-02 [id number]  Visit:  RN Visit Number: 3- Third Visit  RN TIME CALCULATION: Start TIme:  RN Start Time Calculation: 1030 End Time:  RN Stop Time Calculation: 1120 Total Minutes:  RN Time Calculation: 50  Readiness To Change Score:  Readiness to Change Score: 10  Universal RN Interventions: Calendar Distribution: No Exercise Review: Yes Medications: Yes Medication Changes: No Mood: Yes Pain: Yes PCP Advocacy/Support: No Fall Prevention: Yes Incontinence: Yes Clinician View Of Client Situation: Arrived earlier for home visit per Joy Chang request. States she has to help her granddaugher at [employer] today. Aluminum ramp in place by outside agency. Joy Chang ambulating without assistive device. Kitchen area clean and neat without clutter. Client View Of His/Her Situation: Joy Chang states she recently received [facility] medical alert system as courtesy of [facility] benefits. Reports she is doing well and enjoying retirement. Denies falls and reports pain 1 out of 10.  Healthcare Provider Communication: Did Surveyor, mining With [employer] Provider?: No Healthcare Provider Response According to RN: N/A According to Client, Did PCP Report Communication With An Aging Gracefully RN?: No Healthcare Provider Response According To Client: N/A  Clinician View of Client Situation: Clinician View Of Client Situation: Arrived earlier for home visit per Joy Chang request. States she has to help her granddaugher at [employer] today. Aluminum ramp in place by outside agency. Joy Chang ambulating without assistive device. Kitchen area clean and neat without clutter. Client's View of His/Her Situation: Client View Of His/Her Situation: Joy Chang states she recently received [employer] medical alert system as courtesy of [facility] benefits. Reports she is doing well and enjoying retirement. Denies falls and reports pain 1 out of  10.  Medication Assessment: reviewed    OT Update: Pending CHS assessment and contracts  Session Summary: Joy Chang is engaging and upbeat. Endorses enjoying retirement and looks forward to further home modifications and repairs.   Goals Addressed               This Visit's Progress     AG RN (pt-stated)        05/03/2024  Assessment: Joy Chang denies any recent falls. States she had 3 falls at the beginning of the year without injury. Reports recent home floors repaired. States the leveled floors have helped tremendously. Uses rollator outside the home and cane inside the home as needed. Rates knee pain score of 3. Reports chronic knee and back pain. 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 Has not had elected to have surgery. Joy Chang remains active with church activities, grandchildren, and gardening. Joy Chang reports upcoming outpatient therapy appointment on Monday 05/06/24.  Interventions: Provided chronic disease management booklet and writer's contact information. Discussed fall prevention. Encouraged Joy Chang to inquire with the [facility] about potential medical alert system discounts or benefit. Encouraged Joy Chang to carry phone with her at all times in the home for safety reasons.  Plan: Scheduled next home visit August 28th at 1 pm.   CLIENT/RN ACTION PLAN - FALL PREVENTION  Registered Nurse:  Pablo Hurst Date:  05/03/24  Client Name: Joy Chang Client ID:    Target Area:  FALL PREVENTION    Why Problem May Occur: Moving too quickly    Target Goal: No falls over the next 160 days   STRATEGIES Coping Strategies Ideas  Change Positions Slowly: lying to sitting, sitting to standing Reviewed 05/03/24 Reviewed 06/06/24 Reviewed 07/11/24  Changing positions can  make people lightheaded. When getting up in the morning sit for a few minutes, before standing.  Stand for a few minutes before walking and hold on to sturdy furniture or countertop.     Drink water Reviewed 05/03/24 Reviewed 06/06/24 Reviewed 07/11/24 Dehydration can make people dizzy. Ask your healthcare provider how much water you can drink. Decrease caffeine, caffeine makes you urinate a lot, which can make you dehydrated.   If you fall, tell someone Reviewed 05/03/24 Reviewed 06/06/24 Reviewed 07/11/24 Tell a friend or family member even if you didn't get hurt. Tell your healthcare provider if you fall.  They can help you figure out why.   Get your vision and hearing checked Reviewed 05/03/24 Reviewed 06/06/24 Reviewed 07/11/24 Poor vision and hearing loss can make people fall. Glaucoma and diabetes can cause poor vision.   Other    Prevention Ideas  Review your Medicines Reviewed 05/03/24 Reviewed 06/06/24 Reviewed 07/11/24 Many medicines can make you dizzy or sleepy and increase your risk of falling. Your Aging Gracefully Nurse will look at your medications and see if you are taking any that might cause falling.   Activity and Exercise Reviewed 05/03/24 Reviewed 06/06/24 Reviewed 07/11/24 Aging Gracefully exercises Walking (inside or outside) [employer]:  cooking, Education officer, environmental, laundry Exercise while watching TV Swimming or water aerobics.   Strengthen Bones Reviewed 05/03/24 Reviewed 06/06/24 Reviewed 07/11/24 Exercise makes your bones stronger. Vitamin D and Calcium make your bones stronger.  Ask your Healthcare provider if you should be taking them. Your body makes vitamin D from the sun.  Sit in the sun for 10 minutes every day (without sunscreen).   Control your urine  Reviewed 05/03/24 Reviewed 06/06/24 Reviewed 07/11/24 Prevent constipation Cut back on caffeinated drinks. Don't wait to urinate. If diabetic, control your blood sugar. Ask your Aging Gracefully Nurse and Healthcare Provider  about urine control.   Control your blood sugar (if you are diabetic) Reviewed 05/03/24 Reviewed 06/06/24 Reviewed 07/11/24 High blood sugar can cause  frequent urination, poor vision, and numbness in your feet, which can make you fall.   Low blood sugar can cause confusion and dizziness.   Other coping strategies 1. 2. 3. 4. 5.   PRACTICE It is important to practice the strategies so we can determine if they will be effective in helping to reach the goal.    Follow these specific recommendations:        If strategy does not work the first time, try it again.     We may make some changes over the next few sessions.      Pablo Hurst, MSN, RN, BSN [facility], Healthy Communities RN Case Manager for Aging Gracefully Direct Dial: [phone]     06/07/24  Assessment: Joy Chang denies any recent falls. Reports knee pain 0 out of 10 at rest during visit. Reports having more strength in LE lately. Endorses remaining active and performs home exercises regularly. Uses rollator when out in the community. Has walnut tree near her driveway. Uses caution when walking on the drive way. Home modifications by [facility] grant has not started yet. States she submitted the rest of the paperwork on 05/29/24 and they have 30 days to process application. However, Joy Chang states she plans to follow up with [facility] on 06/17/24 for updated status. States she wants to move forward with CHS repairs.   Interventions: Demonstrated AG home exercises. Joy Chang performed return demonstration. AG home exercise booklet provided. Discussed fall  precautions. Discussed medical alert system needs. Provided information regarding affordable medical alert system options. Encouraged Joy Chang to continue to use precautions when walking outside near the walnut tree. Encouraged Ms. Doolen to use rollator when walking outside, around her home, for additional safety. Discussed Clinical research associate will update AG team about pending [facility] work and that Ms. Aldredge wants to proceed with CHS modifications.  Plan: Will update AG team. Scheduled next home visit for  Thursday, Oct.2nd at 1pm.    Pablo Hurst, MSN, RN, BSN [place]  [facility], Healthy Communities RN Case Manager for Aging Gracefully Direct Dial: [phone]      07/11/24   Assessment: Ms. Royal denies any recent falls. Rates pain 1 out of 10. States she has recognized worsening knee pain when she does not drink enough water. States she can tell a big difference in how she feels and pain intensity when she stays hydrated. States she often forgets to drink water. States she has received pre-approval for shower conversion and grab bars from the [place]. States she will follow up in 1 week or so if she has not heard from them. Unsure how the government shut down will affect her home modifications thru the [place]. States [facility]S comes out next Wednesday for an assessment. Ms. Billing reports doing exercises regularly in the morning. Reports staying busy and trying to pace herself at the same time. [employer] medical alert system arrived thru [employer] benefits.  Interventions: Went over the [place] medical alert instructions with Ms. [place]. Discussed different ways to remind herself to drink water such as setting phone alarms on the phone and setting out bottle waters on counter and table. Encouraged consistent exercise. Discussed fall precautions. Encouraged Ms. Mcmanamon to take rollator outside with her when working in [employer] garden.   Plan: will call Ms. Orlando tomorrow to schedule next and last AG RN home visit.   Pablo Hurst, MSN, RN, BSN [place]  [facility], Healthy Communities RN Case Manager for Aging Gracefully Direct Dial: [phone]                               Interventions

## 2024-07-11 NOTE — Patient Instructions (Signed)
 Visit Information  Thank you for taking time to visit with me today. Please don't hesitate to contact me if I can be of assistance to you before our next scheduled home appointment.  Following are the goals we discussed today:   Goals Addressed               This Visit's Progress     AG RN (pt-stated)        05/03/2024  Assessment: Joy Chang denies any recent falls. States she had 3 falls at the beginning of the year without injury. Reports recent home floors repaired. States the leveled floors have helped tremendously. Uses rollator outside the home and cane inside the home as needed. Rates knee pain score of 3. Reports chronic knee and back pain. 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 Has not had elected to have surgery. Joy Chang remains active with church activities, grandchildren, and gardening. Joy Chang reports upcoming outpatient therapy appointment on Monday 05/06/24.  Interventions: Provided chronic disease management booklet and writer's contact information. Discussed fall prevention. Encouraged [facility] to [facility] about potential medical alert system discounts or benefit. Encouraged Joy Chang to carry phone with her at all times in the home for safety reasons.  Plan: Scheduled next home visit August 28th at 1 pm.   CLIENT/RN ACTION PLAN - FALL PREVENTION  Registered Nurse:  Pablo Hurst Date:  05/03/24  Client Name: Joy Chang Client ID:    Target Area:  FALL PREVENTION    Why Problem May Occur: Moving too quickly    Target Goal: No falls over the next 160 days   STRATEGIES Coping Strategies Ideas  Change Positions Slowly: lying to sitting, sitting to standing Reviewed 05/03/24 Reviewed 06/06/24 Reviewed 07/11/24  Changing positions can make people lightheaded. When getting up in the morning sit for a few minutes, before standing.  Stand for a few minutes before walking and hold on to sturdy furniture or countertop.    Drink  water Reviewed 05/03/24 Reviewed 06/06/24 Reviewed 07/11/24 Dehydration can make people dizzy. Ask your healthcare provider how much water you can drink. Decrease caffeine, caffeine makes you urinate a lot, which can make you dehydrated.   If you fall, tell someone Reviewed 05/03/24 Reviewed 06/06/24 Reviewed 07/11/24 Tell a friend or family member even if you didn't get hurt. Tell your healthcare provider if you fall.  They can help you figure out why.   Get your vision and hearing checked Reviewed 05/03/24 Reviewed 06/06/24 Reviewed 07/11/24 Poor vision and hearing loss can make people fall. Glaucoma and diabetes can cause poor vision.   Other    Prevention Ideas  Review your Medicines Reviewed 05/03/24 Reviewed 06/06/24 Reviewed 07/11/24 Many medicines can make you dizzy or sleepy and increase your risk of falling. Your Aging Gracefully Nurse will look at your medications and see if you are taking any that might cause falling.   Activity and Exercise Reviewed 05/03/24 Reviewed 06/06/24 Reviewed 07/11/24 Aging Gracefully exercises Walking (inside or outside) [employer]:  cooking, Education officer, environmental, laundry Exercise while watching TV Swimming or water aerobics.   Strengthen Bones Reviewed 05/03/24 Reviewed 06/06/24 Reviewed 07/11/24 Exercise makes your bones stronger. Vitamin D and Calcium make your bones stronger.  Ask your Healthcare provider if you should be taking them. Your body makes vitamin D from the sun.  Sit in the sun for 10 minutes every day (without sunscreen).   Control your urine  Reviewed 05/03/24 Reviewed 06/06/24 Reviewed 07/11/24 Prevent  constipation Cut back on caffeinated drinks. Don't wait to urinate. If diabetic, control your blood sugar. Ask your Aging Gracefully Nurse and Healthcare Provider  about urine control.   Control your blood sugar (if you are diabetic) Reviewed 05/03/24 Reviewed 06/06/24 Reviewed 07/11/24 High blood sugar can cause frequent  urination, poor vision, and numbness in your feet, which can make you fall.   Low blood sugar can cause confusion and dizziness.   Other coping strategies 1. 2. 3. 4. 5.   PRACTICE It is important to practice the strategies so we can determine if they will be effective in helping to reach the goal.    Follow these specific recommendations:        If strategy does not work the first time, try it again.     We may make some changes over the next few sessions.      Pablo Hurst, MSN, RN, BSN [facility], Healthy Communities RN Case Manager for Aging Gracefully Direct Dial: [phone]     06/07/24  Assessment: Joy Chang denies any recent falls. Reports knee pain 0 out of 10 at rest during visit. Reports having more strength in LE lately. Endorses remaining active and performs home exercises regularly. Uses rollator when out in the community. Has walnut tree near her driveway. Uses caution when walking on the drive way. Home modifications by [facility] grant has not started yet. States she submitted the rest of the paperwork on 05/29/24 and they have 30 days to process application. However, Joy Chang states she plans to follow up with [facility] on 06/17/24 for updated status. States she wants to move forward with CHS repairs.   Interventions: Demonstrated AG home exercises. Joy Chang performed return demonstration. AG home exercise booklet provided. Discussed fall precautions. Discussed medical alert system needs. Provided information regarding affordable medical alert system options. Encouraged Joy Chang to continue to use precautions when walking outside near the walnut tree. Encouraged Joy Chang to use rollator when walking outside, around her home, for additional safety. Discussed Clinical research associate will update AG team about pending [facility] work and that Joy Chang wants to proceed with CHS modifications.  Plan: Will update AG team. Scheduled next home visit for Thursday,  Oct.2nd at 1pm.    Pablo Hurst, MSN, RN, BSN [place]  [facility], Healthy Communities RN Case Manager for Aging Gracefully Direct Dial: [phone]      07/11/24   Assessment: Joy Chang denies any recent falls. Rates pain 1 out of 10. States she has recognized worsening knee pain when she does not drink enough water. States she can tell a big difference in how she feels and pain intensity when she stays hydrated. States she often forgets to drink water. States she has received pre-approval for shower conversion and grab bars from the [place]. States she will follow up in 1 week or so if she has not heard from them. Unsure how the government shut down will affect her home modifications thru the [place]. States [facility] comes out next Wednesday for an assessment. Joy Chang reports doing exercises regularly in the morning. Reports staying busy and trying to pace herself at the same time. [employer] medical alert system arrived thru [employer] benefits.  Interventions: Went over the [place] medical alert instructions with Joy Chang. Discussed different ways to remind herself to drink water such as setting phone alarms on the phone and setting out bottle waters on counter and table. Encouraged consistent exercise. Discussed fall precautions. Encouraged Ms.  Chang to take rollator outside with her when working in [employer] garden.   Plan: will call Joy Chang tomorrow to schedule next and last AG RN home visit.   Pablo Hurst, MSN, RN, BSN [place]  [facility], Healthy Communities RN Case Manager for Aging Gracefully Direct Dial: [phone]                                                                               If you are experiencing a Mental Health or Behavioral Health Crisis or need someone to talk to, please call the Suicide and Crisis Lifeline: 988 call the [place]  National Suicide Prevention Lifeline:  303-226-2533 or TTY: 867 756 4082 TTY 9148742750) to talk to a trained counselor call 1-800-273-TALK (toll free, 24 hour hotline) go to [facility] [address], [place] [phone]) call 911   The patient verbalized understanding of instructions, educational materials, and care plan provided today and agreed to receive a mailed copy of patient instructions, educational materials, and care plan.   Pablo Hurst, MSN, RN, BSN [place]  [facility], Healthy Communities RN Case Manager for Aging Gracefully Direct Dial: [phone]

## 2024-08-15 ENCOUNTER — Encounter: Payer: Self-pay | Admitting: *Deleted

## 2024-08-19 ENCOUNTER — Emergency Department (HOSPITAL_COMMUNITY)

## 2024-08-19 ENCOUNTER — Other Ambulatory Visit: Payer: Self-pay

## 2024-08-19 ENCOUNTER — Emergency Department (HOSPITAL_COMMUNITY)
Admission: EM | Admit: 2024-08-19 | Discharge: 2024-08-19 | Disposition: A | Discharge status: Home / Self Care | Attending: Emergency Medicine | Admitting: Emergency Medicine

## 2024-08-19 ENCOUNTER — Encounter (HOSPITAL_COMMUNITY): Payer: Self-pay

## 2024-08-19 DIAGNOSIS — N189 Chronic kidney disease, unspecified: Secondary | ICD-10-CM | POA: Insufficient documentation

## 2024-08-19 DIAGNOSIS — Z7982 Long term (current) use of aspirin: Secondary | ICD-10-CM | POA: Insufficient documentation

## 2024-08-19 DIAGNOSIS — Z7951 Long term (current) use of inhaled steroids: Secondary | ICD-10-CM | POA: Insufficient documentation

## 2024-08-19 DIAGNOSIS — Z79899 Other long term (current) drug therapy: Secondary | ICD-10-CM | POA: Diagnosis not present

## 2024-08-19 DIAGNOSIS — J45909 Unspecified asthma, uncomplicated: Secondary | ICD-10-CM | POA: Insufficient documentation

## 2024-08-19 DIAGNOSIS — R059 Cough, unspecified: Secondary | ICD-10-CM | POA: Diagnosis present

## 2024-08-19 DIAGNOSIS — I129 Hypertensive chronic kidney disease with stage 1 through stage 4 chronic kidney disease, or unspecified chronic kidney disease: Secondary | ICD-10-CM | POA: Insufficient documentation

## 2024-08-19 DIAGNOSIS — J069 Acute upper respiratory infection, unspecified: Secondary | ICD-10-CM | POA: Diagnosis not present

## 2024-08-19 LAB — RESP PANEL BY RT-PCR (RSV, FLU A&B, COVID)  RVPGX2
Influenza A by PCR: NEGATIVE
Influenza B by PCR: NEGATIVE
Resp Syncytial Virus by PCR: NEGATIVE
SARS Coronavirus 2 by RT PCR: NEGATIVE

## 2024-08-19 NOTE — Discharge Instructions (Signed)
 It was a pleasure taking care of you today. You were seen in the Emergency Department for COVID-like symptoms. Your work-up was reassuring. Your chest x-ray showed no acute abnormality.  Your respiratory panel was negative for flu, COVID, RSV.  Your symptoms are likely secondary to a head cold or other viral respiratory infection.  I recommend continuing to treat your symptoms as needed with Tylenol.  If you do not start to feel better in the next 1-2 days, you can follow-up with your PCP.  I also suggest you drink plenty of water, warm soup and other fluids to stay hydrated.Joy Chang Refer to the attached documentation for further management of your symptoms.   Please return to the ER if you experience chest pain, trouble breathing, intractable nausea/vomiting or any other life threatening illnesses.

## 2024-08-19 NOTE — ED Notes (Signed)
 Patient transported to X-ray

## 2024-08-19 NOTE — ED Triage Notes (Signed)
 Patient states she been having covid like symptoms since Saturday night. Patient thinks she has a fever now. She denies shob.

## 2024-08-19 NOTE — ED Provider Notes (Signed)
 [place] [facility] Provider Note   CSN: [id number] Arrival date & time: 08/19/24  1111     Patient presents with: No chief complaint on file.   Joy Chang is a 63 y.o. female with pmhx of asthma, CKD, GERD, hypertension who presents emergency department for evaluation of COVID-like symptoms and nausea.  Patient states her symptoms began on Saturday night and include congestion, cough, loss of taste and smell, ear pain, body aches.  She denies any fever, but she takes Tylenol 3-4 times per day regularly.  She denies chest pain and shortness of breath, but she does state it is sometimes difficult to take a deep breath.  HPI     Prior to Admission medications   Medication Sig Start Date End Date Taking? Authorizing Provider  amLODipine (NORVASC) 10 MG tablet Take 10 mg by mouth daily.    [provider]  aspirin 81 MG chewable tablet Chew by mouth daily.    [provider]  benzonatate (TESSALON) 100 MG capsule Take 100 mg by mouth 3 (three) times daily as needed for cough.    [provider]  budesonide (GNP BUDESONIDE NASAL SPRAY) 32 MCG/ACT nasal spray Place 2 sprays into both nostrils daily.    [provider]  Calcium Carb-Cholecalciferol (CALCIUM-VITAMIN D) 600-400 MG-UNIT TABS Take 1 tablet by mouth 2 (two) times daily. Patient not taking: Reported on 05/03/2024    [provider]  cetirizine (ZYRTEC) 10 MG tablet Take 10 mg by mouth daily.    [provider]  Cholecalciferol 1000 units tablet Take 1,000 Units by mouth daily.    [provider]  cyanocobalamin 1000 MCG tablet Take 1,000 mcg by mouth daily.    [provider]  diclofenac Sodium (VOLTAREN) 1 % GEL Apply topically. 10/01/20   [provider]  ferrous sulfate 325 (65 FE) MG tablet Take by mouth. 01/22/21   [provider]  fluticasone-salmeterol (ADVAIR HFA) 230-21 MCG/ACT inhaler Inhale 2  puffs into the lungs 2 (two) times daily.    [provider]  hydrochlorothiazide (HYDRODIURIL) 25 MG tablet Take 25 mg by mouth daily.  Patient not taking: Reported on 05/03/2024 07/14/14   [provider]  lidocaine (LIDODERM) 5 % Place 1 patch onto the skin daily as needed. Remove & Discard patch within 12 hours or as directed by MD    [provider]  methocarbamol  (ROBAXIN ) 750 MG tablet Take 750 mg by mouth 4 (four) times daily.    [provider]  montelukast (SINGULAIR) 10 MG tablet Take 10 mg by mouth at bedtime.    [provider]  Omega-3 Fatty Acids (FISH OIL) 1000 MG CAPS Take 1 capsule by mouth 2 (two) times daily. Patient not taking: Reported on 05/03/2024    [provider]  polyethylene glycol powder (GLYCOLAX/MIRALAX) 17 GM/SCOOP powder Take 17 g by mouth daily as needed for mild constipation. Dissolve 1 capful (17g) in 4-8 ounces of liquid and take by mouth daily.    [provider]  potassium chloride  SA (KLOR-CON ) 20 MEQ tablet Take by mouth. 01/27/21   [provider]  predniSONE  (DELTASONE ) 20 MG tablet Take 2 tablets (40 mg total) by mouth daily. Patient not taking: Reported on 05/03/2024 01/04/23   Rolinda Rogue, MD  promethazine -dextromethorphan (PROMETHAZINE -DM) 6.25-15 MG/5ML syrup Take 5 mLs by mouth 4 (four) times daily as needed for cough. 01/04/23   Rolinda Rogue, MD  PROVENTIL HFA 108 (90 BASE) MCG/ACT  inhaler Inhale 1 puff into the lungs every 6 (six) hours as needed for wheezing or shortness of breath.  06/11/14   [provider]  spironolactone (ALDACTONE) 25 MG tablet Take 1 tablet by mouth 2 (two) times daily. 02/21/20   [provider]  vitamin E 400 UNIT capsule Take 400 Units by mouth daily.    [provider]    Allergies: Aspartame and phenylalanine, Lisinopril, and Sulfa antibiotics    Review of Systems  Constitutional:  Positive for fatigue.  HENT:  Positive for  congestion.     Updated Vital Signs BP 136/72   Pulse 74   Temp 98.6 F (37 C)   Resp 18   Ht 5' 4 (1.626 m)   Wt 99.8 kg   SpO2 97%   BMI 37.76 kg/m   Physical Exam Vitals and nursing note reviewed.  Constitutional:      Appearance: Normal appearance.  HENT:     Head: Normocephalic and atraumatic.     Mouth/Throat:     Mouth: Mucous membranes are moist.  Eyes:     General: No scleral icterus.       Right eye: No discharge.        Left eye: No discharge.     Conjunctiva/sclera: Conjunctivae normal.  Cardiovascular:     Rate and Rhythm: Normal rate and regular rhythm.     Pulses: Normal pulses.     Comments: 2+ radial pulses bilaterally. No pedal edema. Pulmonary:     Effort: Pulmonary effort is normal. No respiratory distress.     Breath sounds: Normal breath sounds.  Abdominal:     General: There is no distension.     Tenderness: There is no abdominal tenderness.  Musculoskeletal:        General: No deformity.     Cervical back: Normal range of motion.  Skin:    General: Skin is warm and dry.     Capillary Refill: Capillary refill takes less than 2 seconds.  Neurological:     Mental Status: She is alert.     Motor: No weakness.  Psychiatric:        Mood and Affect: Mood normal.     (all labs ordered are listed, but only abnormal results are displayed) Labs Reviewed  RESP PANEL BY RT-PCR (RSV, FLU A&B, COVID)  [id number]    EKG: None  Radiology: DG Chest 2 View Result Date: 08/19/2024 CLINICAL DATA:  Shortness of breath EXAM: CHEST - 2 VIEW COMPARISON:  Chest x-ray performed May 13, 2021 FINDINGS: Heart mediastinum are not significantly changed. No pleural effusion or pneumothorax. No focal infiltrate. Mild degenerative changes in the thoracic spine. IMPRESSION: No active cardiopulmonary disease. Electronically Signed   By: Maude Naegeli M.D.   On: 08/19/2024 13:02     Procedures   Medications Ordered in the ED - No data to display                                  Medical Decision Making Amount and/or Complexity of Data Reviewed Radiology: ordered.   This patient presents to the ED for concern of covid-like symptoms, this involves an extensive number of treatment options, and is a complaint that carries with it a high risk of complications and morbidity.   Differential diagnosis includes: flu, covid, RSV, URI  Co morbidities:  asthma, CKD, GERD, hypertension   Lab Tests:  Labs not indicated.  Respiratory panel negative.  Imaging Studies:  I ordered imaging studies including chest xray I independently visualized and interpreted imaging which showed no evidence of cardiopulmonary disease I agree with the radiologist interpretation  Cardiac Monitoring/ECG:  The patient was maintained on a cardiac monitor.  I personally viewed and interpreted the cardiac monitored which showed an underlying rhythm of: normal sinus  Medicines ordered and prescription drug management:  I offered patient zofran , but she declined  Test Considered:  none  Critical Interventions:  none  Consultations Obtained: none  Problem List / ED Course:     ICD-10-CM   1. Viral upper respiratory tract infection  J06.9       MDM: 63 year old female who presents emergency department for evaluation of COVID-like symptoms.  Patient states beginning on Saturday evening, she started to feel body aches, congestion, cough, loss of taste and smell.  She denies fever, however she takes Tylenol 3-4 times per day for other pain unrelated to her illness.  She denies any recent sick contacts.  Respiratory panel is negative for flu, COVID, RSV.  She does report some nausea, however declines Zofran  at this time.  Her chest x-ray shows no evidence of cardiopulmonary disease. Patient's symptoms are likely secondary to viral URI or head cold. I recommended she continue taking tylenol as needed for body aches and pain control. Patient verbalized her understanding to  this. I offered her an outpatient prescription for Zofran  given her nausea, but she declined. Patient educated on staying hydrated and resting until her symptoms improve. Vital signs remain stable. Patient is appropriate for discharge at this time.   Dispostion:  After consideration of the diagnostic results and the patients response to treatment, I feel that the patient would benefit from supportive care outpatient.   Final diagnoses:  Viral upper respiratory tract infection    ED Discharge Orders     None          Torrence Marry GORMAN DEVONNA 08/19/24 1345    Dean Clarity, MD 08/19/24 1513

## 2024-08-22 ENCOUNTER — Other Ambulatory Visit: Payer: Self-pay | Admitting: *Deleted

## 2024-08-22 NOTE — Patient Instructions (Signed)
 Visit Information  Thank you for taking time to visit with me today. Please don't hesitate to contact me if I can be of assistance to you before our next scheduled home appointment.  Following are the goals we discussed today:   Goals Addressed               This Visit's Progress     COMPLETED: AG RN (pt-stated)        05/03/2024  Assessment: Joy Chang denies any recent falls. States she had 3 falls at the beginning of the year without injury. Reports recent home floors repaired. States the leveled floors have helped tremendously. Uses rollator outside the home and cane inside the home as needed. Rates knee pain score of 3. Reports chronic knee and back pain. 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 Has not had elected to have surgery. Joy Chang remains active with church activities, grandchildren, and gardening. Joy Chang reports upcoming outpatient therapy appointment on Monday 05/06/24.  Interventions: Provided chronic disease management booklet and writer's contact information. Discussed fall prevention. Encouraged [facility] about potential [facility] system discounts or benefit. Encouraged Joy Chang to carry phone with her at all times in the home for safety reasons.  Plan: Scheduled next home visit August 28th at 1 pm.   CLIENT/RN ACTION PLAN - FALL PREVENTION  Registered Nurse:  Pablo Hurst Date:  05/03/24  Client Name: Joy Chang Client ID:    Target Area:  FALL PREVENTION    Why Problem May Occur: Moving too quickly    Target Goal: No falls over the next 160 days   STRATEGIES Coping Strategies Ideas  Change Positions Slowly: lying to sitting, sitting to standing Reviewed 05/03/24 Reviewed 06/06/24 Reviewed 07/11/24 Reviewed 08/22/24  Changing positions can make people lightheaded. When getting up in the morning sit for a few minutes, before standing.  Stand for a few minutes before walking and hold on to sturdy furniture or  countertop.    Drink water Reviewed 05/03/24 Reviewed 06/06/24 Reviewed 07/11/24 Reviewed 08/22/24 Dehydration can make people dizzy. Ask your healthcare provider how much water you can drink. Decrease caffeine, caffeine makes you urinate a lot, which can make you dehydrated.   If you fall, tell someone Reviewed 05/03/24 Reviewed 06/06/24 Reviewed 07/11/24 Reviewed 08/22/24 Tell a friend or family member even if you didn't get hurt. Tell your healthcare provider if you fall.  They can help you figure out why.   Get your vision and hearing checked Reviewed 05/03/24 Reviewed 06/06/24 Reviewed 07/11/24 Reviewed 08/22/24 Poor vision and hearing loss can make people fall. Glaucoma and diabetes can cause poor vision.   Other    Prevention Ideas  Review your Medicines Reviewed 05/03/24 Reviewed 06/06/24 Reviewed 07/11/24 Reviewed 08/22/24 Many medicines can make you dizzy or sleepy and increase your risk of falling. Your Aging Gracefully Nurse will look at your medications and see if you are taking any that might cause falling.   Activity and Exercise Reviewed 05/03/24 Reviewed 06/06/24 Reviewed 07/11/24 Reviewed 08/22/24 Aging Gracefully exercises Walking (inside or outside) [employer]:  cooking, education officer, environmental, laundry Exercise while watching TV Swimming or water aerobics.   Strengthen Bones Reviewed 05/03/24 Reviewed 06/06/24 Reviewed 07/11/24 Reviewed 08/22/24 Exercise makes your bones stronger. Vitamin D and Calcium make your bones stronger.  Ask your Healthcare provider if you should be taking them. Your body makes vitamin D from the sun.  Sit in the sun for 10 minutes every day (  without sunscreen).   Control your urine  Reviewed 05/03/24 Reviewed 06/06/24 Reviewed 07/11/24 Reviewed 08/22/24 Prevent constipation Cut back on caffeinated drinks. Don't wait to urinate. If diabetic, control your blood sugar. Ask your Aging Gracefully Nurse and Healthcare Provider  about  urine control.   Control your blood sugar (if you are diabetic) Reviewed 05/03/24 Reviewed 06/06/24 Reviewed 07/11/24 Reviewed 08/22/24 High blood sugar can cause frequent urination, poor vision, and numbness in your feet, which can make you fall.   Low blood sugar can cause confusion and dizziness.   Other coping strategies 1. 2. 3. 4. 5.   PRACTICE It is important to practice the strategies so we can determine if they will be effective in helping to reach the goal.    Follow these specific recommendations:        If strategy does not work the first time, try it again.     We may make some changes over the next few sessions.      Pablo Hurst, MSN, RN, BSN [facility], Healthy Communities RN Case Manager for Aging Gracefully Direct Dial: [phone]     06/07/24  Assessment: Joy Chang denies any recent falls. Reports knee pain 0 out of 10 at rest during visit. Reports having more strength in LE lately. Endorses remaining active and performs home exercises regularly. Uses rollator when out in the community. Has walnut tree near her driveway. Uses caution when walking on the drive way. Home modifications by [facility] grant has not started yet. States she submitted the rest of the paperwork on 05/29/24 and they have 30 days to process application. However, Joy Chang states she plans [facility] on [facility] for updated status. States she wants to move forward with CHS repairs.   Interventions: Demonstrated AG home exercises. Joy Chang performed return demonstration. AG home exercise booklet provided. Discussed fall precautions. Discussed medical alert system needs. Provided information regarding affordable medical alert system options. Encouraged Joy Chang to continue to use precautions when walking outside near the walnut tree. Encouraged Joy Chang to use rollator when walking outside, around her home, for additional safety. Discussed clinical research associate  will update AG team about pending [facility] work and that Joy Chang wants to proceed with CHS modifications.  Plan: Will update AG team. Scheduled next home visit for Thursday, Oct.2nd at 1pm.    Pablo Hurst, MSN, RN, BSN [place]  [facility], Healthy Communities RN Case Manager for Aging Gracefully Direct Dial: [phone]      07/11/24   Assessment: Joy Chang denies any recent falls. Rates pain 1 out of 10. States she has recognized worsening knee pain when she does not drink enough water. States she can tell a big difference in how she feels and pain intensity when she stays hydrated. States she often forgets to drink water. States she has received pre-approval for shower conversion and grab bars from the [place]. States she will follow up in 1 week or so if she has not heard from them. Unsure how the government shut down will affect her home modifications thru the [place]. States CHS comes out next Wednesday for an assessment. Joy Chang. Haubner reports doing exercises regularly in the morning. Reports staying busy and trying to pace herself at the same time. [employer] medical alert system arrived thru [place] benefits.  Interventions: Went over the [place] medical alert instructions with Joy Chang. Discussed different ways to remind herself to drink water such as setting phone alarms on  the phone and setting out bottle waters on counter and table. Encouraged consistent exercise. Discussed fall precautions. Encouraged Joy Chang. Laguardia to take rollator outside with her when working in directv garden.   Plan: will call Joy Chang tomorrow to schedule next and last AG RN home visit.   Pablo Hurst, MSN, RN, BSN [place]  [facility], Healthy Communities RN Case Manager for Aging Gracefully Direct Dial: [phone]        08/22/24  Assessment: Joy Chang. Moxey is no longer working with [facility] for home modifications. So far the ramp has been installed, new toilet, and grab  bars. The rest of the work will be worked on next week. She remains eligible for Aging Gracefully program. Joy Chang. Talcott reports having increased knee pain as of late. Pain 1 out of 10 when sitting and 4 out of 10 when walking. Has upcoming ortho appointment next week for steroid injection. The previous gel injection did not help with pain. Joy Chang. Hughett states she has been limited with activity due to pain. She uses rollator as needed. Will also be seen by pain management specialist. Awaiting appointment. Joy Chang. Bonzo continues to practice home Aging Gracefully exercises. Joy Chang. Mandile also wears home medical alert device at all times.    Interventions: Encouraged Joy Chang to request outpatient therapy referral from her PCP. Discussed fall precautions. Encouraged Joy Chang. Char to be mindful of potential dizziness if new medications are started. Encouraged Joy Chang. Voland to stay hydrated. Discussed this is writer's last home visit. Discussed AG OT will reach out to schedule home visit.  Plan: Will notify Aging Gracefully team of writer's last visit.   Goal: met                                                                       If you are experiencing a Mental Health or Behavioral Health Crisis or need someone to talk to, please call the Suicide and Crisis Lifeline: 988 call the [place]  National Suicide Prevention Lifeline: (640) 730-5404 or TTY: 709-605-4660 TTY 470-664-6637) to talk to a trained counselor call 1-800-273-TALK (toll free, 24 hour hotline) go to [facility] [address], [place] [phone]) call 911   The patient verbalized understanding of instructions, educational materials, and care plan provided today and agreed to receive a mailed copy of patient instructions, educational materials, and care plan.   Pablo Hurst, MSN, RN, BSN [place]  [facility], Healthy  Communities RN Case Manager for Aging Gracefully Direct Dial: [phone]

## 2024-08-22 NOTE — Patient Outreach (Signed)
 Aging Gracefully Program  RN Visit  08/22/2024  Joy Chang 02/02/1961 [id number]  Visit:  RN Visit Number: 4- Fourth Visit  RN TIME CALCULATION: Start TIme:  RN Start Time Calculation: 1100 End Time:  RN Stop Time Calculation: 1230 Total Minutes:  RN Time Calculation: 90  Readiness To Change Score:  Readiness to Change Score: 10  Universal RN Interventions: Calendar Distribution: No Exercise Review: Yes Medications: Yes Medication Changes: No Mood: Yes Pain: Yes PCP Advocacy/Support: No Fall Prevention: Yes Incontinence: Yes Clinician View Of Client Situation: Arrived for home visit. Joy Chang answered door cheerfully. Ambulating without assistive device. Home neat and clean. Ramp outside intact. Client View Of His/Her Situation: Joy Chang reports pain 1 out of 10. States knee pain 4 out of 10 when walking. Reports CHS is no longer involved. She will continue to work with the contractor she currently has as benefit of [employer].  Healthcare Provider Communication: Did Surveyor, Mining [employer] Provider?: No Healthcare Provider Response According to RN: N/A According to Client, Did PCP Report Communication With An Aging Gracefully RN?: No Healthcare Provider Response According To Client: N/A  Clinician View of Client Situation: Clinician View Of Client Situation: Arrived for home visit. Joy Chang answered door cheerfully. Ambulating without assistive device. Home neat and clean. Ramp outside intact. Client's View of His/Her Situation: Client View Of His/Her Situation: Joy Chang reports pain 1 out of 10. States knee pain 4 out of 10 when walking. Reports CHS is no longer involved. She will continue to work with the contractor she currently has as benefit of [employer].  Medication Assessment: Reviewed    OT Update: Pending final visit  Session Summary: Joy Chang is managing well despite increased pain. Has upcoming appointments that will  hopefully remedy knee pain. Joy Chang declined further involvement with CHS by sending letter to [facility]. She elected to proceed with current contractor for home modifications as benefit of her [employer].   Goals Addressed               This Visit's Progress     COMPLETED: AG RN (pt-stated)        05/03/2024  Assessment: Joy Chang denies any recent falls. States she had 3 falls at the beginning of the year without injury. Reports recent home floors repaired. States the leveled floors have helped tremendously. Uses rollator outside the home and cane inside the home as needed. Rates knee pain score of 3. Reports chronic knee and back pain. 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 Has not had elected to have surgery. Joy Chang remains active with church activities, grandchildren, and gardening. Joy Chang reports upcoming outpatient therapy appointment on Monday 05/06/24.  Interventions: Provided chronic disease management booklet and writer's contact information. Discussed fall prevention. Encouraged Joy Chang to inquire with [facility] about potential medical alert system discounts or benefit. Encouraged Joy Chang to carry phone with her at all times in the home for safety reasons.  Plan: Scheduled next home visit August 28th at 1 pm.   CLIENT/RN ACTION PLAN - FALL PREVENTION  Registered Nurse:  Pablo Hurst Date:  05/03/24  Client Name: Joy Chang Client ID:    Target Area:  FALL PREVENTION    Why Problem May Occur: Moving too quickly    Target Goal: No falls over the next 160 days   STRATEGIES Coping Strategies Ideas  Change Positions Slowly: lying to sitting, sitting to standing Reviewed 05/03/24 Reviewed 06/06/24 Reviewed  07/11/24 Reviewed 08/22/24  Changing positions can make people lightheaded. When getting up in the morning sit for a few minutes, before standing.  Stand for a few minutes before walking and hold on to sturdy furniture or countertop.    Drink  water Reviewed 05/03/24 Reviewed 06/06/24 Reviewed 07/11/24 Reviewed 08/22/24 Dehydration can make people dizzy. Ask your healthcare provider how much water you can drink. Decrease caffeine, caffeine makes you urinate a lot, which can make you dehydrated.   If you fall, tell someone Reviewed 05/03/24 Reviewed 06/06/24 Reviewed 07/11/24 Reviewed 08/22/24 Tell a friend or family member even if you didn't get hurt. Tell your healthcare provider if you fall.  They can help you figure out why.   Get your vision and hearing checked Reviewed 05/03/24 Reviewed 06/06/24 Reviewed 07/11/24 Reviewed 08/22/24 Poor vision and hearing loss can make people fall. Glaucoma and diabetes can cause poor vision.   Other    Prevention Ideas  Review your Medicines Reviewed 05/03/24 Reviewed 06/06/24 Reviewed 07/11/24 Reviewed 08/22/24 Many medicines can make you dizzy or sleepy and increase your risk of falling. Your Aging Gracefully Nurse will look at your medications and see if you are taking any that might cause falling.   Activity and Exercise Reviewed 05/03/24 Reviewed 06/06/24 Reviewed 07/11/24 Reviewed 08/22/24 Aging Gracefully exercises Walking (inside or outside) [employer]:  cooking, education officer, environmental, laundry Exercise while watching TV Swimming or water aerobics.   Strengthen Bones Reviewed 05/03/24 Reviewed 06/06/24 Reviewed 07/11/24 Reviewed 08/22/24 Exercise makes your bones stronger. Vitamin D and Calcium make your bones stronger.  Ask your Healthcare provider if you should be taking them. Your body makes vitamin D from the sun.  Sit in the sun for 10 minutes every day (without sunscreen).   Control your urine  Reviewed 05/03/24 Reviewed 06/06/24 Reviewed 07/11/24 Reviewed 08/22/24 Prevent constipation Cut back on caffeinated drinks. Don't wait to urinate. If diabetic, control your blood sugar. Ask your Aging Gracefully Nurse and Healthcare Provider  about urine control.    Control your blood sugar (if you are diabetic) Reviewed 05/03/24 Reviewed 06/06/24 Reviewed 07/11/24 Reviewed 08/22/24 High blood sugar can cause frequent urination, poor vision, and numbness in your feet, which can make you fall.   Low blood sugar can cause confusion and dizziness.   Other coping strategies 1. 2. 3. 4. 5.   PRACTICE It is important to practice the strategies so we can determine if they will be effective in helping to reach the goal.    Follow these specific recommendations:        If strategy does not work the first time, try it again.     We may make some changes over the next few sessions.      Pablo Hurst, MSN, RN, BSN [facility], Healthy Communities RN Case Manager for Aging Gracefully Direct Dial: [phone]     06/07/24  Assessment: Ms. Barcia denies any recent falls. Reports knee pain 0 out of 10 at rest during visit. Reports having more strength in LE lately. Endorses remaining active and performs home exercises regularly. Uses rollator when out in the community. Has walnut tree near her driveway. Uses caution when walking on the drive way. Home modifications by [facility] grant has not started yet. States she submitted the rest of the paperwork on 05/29/24 and they have 30 days to process application. However, Ms. Florido states she plans to [facility] on [facility] for updated status. States she wants to move  forward with CHS repairs.   Interventions: Demonstrated AG home exercises. Ms. Sunde performed return demonstration. AG home exercise booklet provided. Discussed fall precautions. Discussed medical alert system needs. Provided information regarding affordable medical alert system options. Encouraged Ms. Schmuhl to continue to use precautions when walking outside near the walnut tree. Encouraged Ms. Emile to use rollator when walking outside, around her home, for additional safety. Discussed clinical research associate will update AG team  about pending [facility] work and that Ms. Pellegrin wants to proceed with CHS modifications.  Plan: Will update AG team. Scheduled next home visit for Thursday, Oct.2nd at 1pm.    Pablo Hurst, MSN, RN, BSN [place]  [facility], Healthy Communities RN Case Manager for Aging Gracefully Direct Dial: [phone]      07/11/24   Assessment: Ms. Riccobono denies any recent falls. Rates pain 1 out of 10. States she has recognized worsening knee pain when she does not drink enough water. States she can tell a big difference in how she feels and pain intensity when she stays hydrated. States she often forgets to drink water. States she has received pre-approval for shower conversion and grab bars from the [place]. States she will follow up in 1 week or so if she has not heard from them. Unsure how the government shut down will affect her home modifications thru the [place]. States CHS comes out next Wednesday for an assessment. Ms. Sirmons reports doing exercises regularly in the morning. Reports staying busy and trying to pace herself at the same time. [employer] medical alert system arrived thru [place] benefits.  Interventions: Went over the [place] medical alert instructions with Ms. Orlando. Discussed different ways to remind herself to drink water such as setting phone alarms on the phone and setting out bottle waters on counter and table. Encouraged consistent exercise. Discussed fall precautions. Encouraged Ms. Mcginnity to take rollator outside with her when working in directv garden.   Plan: will call Ms. Orlando tomorrow to schedule next and last AG RN home visit.   Pablo Hurst, MSN, RN, BSN [place]  [facility], Healthy Communities RN Case Manager for Aging Gracefully Direct Dial: [phone]        08/22/24  Assessment: Ms. Nims is no longer working with [facility] for home modifications. So far the ramp has been installed, new toilet, and grab bars. The rest of the  work will be worked on next week. She remains eligible for Aging Gracefully program. Ms. Hanak reports having increased knee pain as of late. Pain 1 out of 10 when sitting and 4 out of 10 when walking. Has upcoming ortho appointment next week for steroid injection. The previous gel injection did not help with pain. Ms. Noy states she has been limited with activity due to pain. She uses rollator as needed. Will also be seen by pain management specialist. Awaiting appointment. Ms. Lewing continues to practice home Aging Gracefully exercises. Ms. Lamontagne also wears home medical alert device at all times.    Interventions: Encouraged Ms. Orlando to request outpatient therapy referral from her PCP. Discussed fall precautions. Encouraged Ms. Caniglia to be mindful of potential dizziness if new medications are started. Encouraged Ms. Engelbrecht to stay hydrated. Discussed this is writer's last home visit. Discussed AG OT will reach out to schedule home visit.  Plan: Will notify Aging Gracefully team of writer's last visit.   Goal: met  Pablo Hurst, MSN, RN, BSN [place]  [facility], Healthy Communities RN Case Manager for Aging Gracefully Direct Dial: [phone]
